# Patient Record
Sex: Male | Born: 1945
Health system: Southern US, Community
[De-identification: ages and names within clinical notes are randomized; demographics above are authoritative.]

## PROBLEM LIST (undated history)

## (undated) DIAGNOSIS — M439 Deforming dorsopathy, unspecified: Secondary | ICD-10-CM

## (undated) DIAGNOSIS — Z9989 Dependence on other enabling machines and devices: Secondary | ICD-10-CM

## (undated) DIAGNOSIS — Z974 Presence of external hearing-aid: Secondary | ICD-10-CM

## (undated) DIAGNOSIS — K219 Gastro-esophageal reflux disease without esophagitis: Secondary | ICD-10-CM

## (undated) DIAGNOSIS — M1990 Unspecified osteoarthritis, unspecified site: Secondary | ICD-10-CM

## (undated) DIAGNOSIS — R29898 Other symptoms and signs involving the musculoskeletal system: Secondary | ICD-10-CM

## (undated) DIAGNOSIS — I10 Essential (primary) hypertension: Secondary | ICD-10-CM

## (undated) DIAGNOSIS — A809 Acute poliomyelitis, unspecified: Secondary | ICD-10-CM

## (undated) DIAGNOSIS — F40240 Claustrophobia: Secondary | ICD-10-CM

## (undated) HISTORY — PX: FEMUR SURGERY: SHX943

## (undated) HISTORY — PX: JOINT REPLACEMENT: SHX530

## (undated) HISTORY — PX: TIBIA FRACTURE SURGERY: SHX806

---

## 1998-02-01 HISTORY — PX: CARDIAC CATHETERIZATION: SHX172

## 2011-06-30 ENCOUNTER — Ambulatory Visit: Payer: Self-pay | Admitting: Gastroenterology

## 2011-10-13 ENCOUNTER — Other Ambulatory Visit: Payer: Self-pay | Admitting: Neurosurgery

## 2011-10-13 DIAGNOSIS — M549 Dorsalgia, unspecified: Secondary | ICD-10-CM

## 2011-10-19 ENCOUNTER — Ambulatory Visit
Admission: RE | Admit: 2011-10-19 | Discharge: 2011-10-19 | Disposition: A | Discharge status: Home / Self Care | Payer: Medicare Other | Source: Ambulatory Visit | Attending: Neurosurgery | Admitting: Neurosurgery

## 2011-10-19 DIAGNOSIS — M549 Dorsalgia, unspecified: Secondary | ICD-10-CM

## 2011-11-29 ENCOUNTER — Ambulatory Visit: Payer: Self-pay | Admitting: Pain Medicine

## 2012-03-22 ENCOUNTER — Ambulatory Visit: Payer: Self-pay | Admitting: Gastroenterology

## 2012-03-24 LAB — PATHOLOGY REPORT

## 2012-09-25 ENCOUNTER — Ambulatory Visit: Payer: Self-pay | Admitting: General Practice

## 2012-09-25 LAB — URINALYSIS, COMPLETE
Bilirubin,UR: NEGATIVE
Glucose,UR: NEGATIVE mg/dL (ref 0–75)
Hyaline Cast: 2
Ketone: NEGATIVE
Leukocyte Esterase: NEGATIVE
Protein: NEGATIVE
RBC,UR: <1 /HPF (ref 0–5)
Specific Gravity: 1.021 (ref 1.003–1.030)

## 2012-09-25 LAB — PROTIME-INR: Prothrombin Time: 11.8 secs (ref 11.5–14.7)

## 2012-09-25 LAB — BASIC METABOLIC PANEL
Anion Gap: 4 — ABNORMAL LOW (ref 7–16)
Calcium, Total: 8.8 mg/dL (ref 8.5–10.1)
Chloride: 106 mmol/L (ref 98–107)
Creatinine: 0.94 mg/dL (ref 0.60–1.30)
EGFR (African American): >60
Glucose: 71 mg/dL (ref 65–99)
Potassium: 4.7 mmol/L (ref 3.5–5.1)

## 2012-09-25 LAB — CBC
HCT: 39.6 % — ABNORMAL LOW (ref 40.0–52.0)
Platelet: 227 10*3/uL (ref 150–440)
RDW: 13.2 % (ref 11.5–14.5)
WBC: 5.8 10*3/uL (ref 3.8–10.6)

## 2012-09-25 LAB — SEDIMENTATION RATE: Erythrocyte Sed Rate: 7 mm/hr (ref 0–20)

## 2012-09-25 LAB — MRSA PCR SCREENING

## 2012-09-26 LAB — URINE CULTURE

## 2012-10-09 ENCOUNTER — Inpatient Hospital Stay: Payer: Self-pay | Admitting: General Practice

## 2012-10-10 LAB — BASIC METABOLIC PANEL
BUN: 14 mg/dL (ref 7–18)
Calcium, Total: 7.8 mg/dL — ABNORMAL LOW (ref 8.5–10.1)
Chloride: 104 mmol/L (ref 98–107)
Co2: 25 mmol/L (ref 21–32)
EGFR (Non-African Amer.): >60
Glucose: 102 mg/dL — ABNORMAL HIGH (ref 65–99)
Osmolality: 273 (ref 275–301)
Potassium: 3.5 mmol/L (ref 3.5–5.1)
Sodium: 136 mmol/L (ref 136–145)

## 2012-10-10 LAB — PLATELET COUNT: Platelet: 178 10*3/uL (ref 150–440)

## 2012-10-10 LAB — HEMOGLOBIN: HGB: 9.9 g/dL — ABNORMAL LOW (ref 13.0–18.0)

## 2012-10-11 LAB — BASIC METABOLIC PANEL
Anion Gap: 4 — ABNORMAL LOW (ref 7–16)
BUN: 10 mg/dL (ref 7–18)
Calcium, Total: 8.2 mg/dL — ABNORMAL LOW (ref 8.5–10.1)
Chloride: 108 mmol/L — ABNORMAL HIGH (ref 98–107)
Co2: 28 mmol/L (ref 21–32)
EGFR (Non-African Amer.): >60
Glucose: 105 mg/dL — ABNORMAL HIGH (ref 65–99)
Osmolality: 279 (ref 275–301)
Potassium: 3.9 mmol/L (ref 3.5–5.1)

## 2012-10-11 LAB — PLATELET COUNT: Platelet: 198 10*3/uL (ref 150–440)

## 2012-10-11 LAB — PATHOLOGY REPORT

## 2012-10-11 LAB — HEMOGLOBIN: HGB: 10.2 g/dL — ABNORMAL LOW (ref 13.0–18.0)

## 2012-10-13 ENCOUNTER — Encounter: Payer: Self-pay | Admitting: Internal Medicine

## 2012-11-01 ENCOUNTER — Encounter: Payer: Self-pay | Admitting: Internal Medicine

## 2013-05-01 ENCOUNTER — Ambulatory Visit: Payer: Self-pay | Admitting: General Practice

## 2013-08-27 ENCOUNTER — Ambulatory Visit: Payer: Self-pay | Admitting: Orthopedic Surgery

## 2013-08-27 LAB — URINALYSIS, COMPLETE
Bacteria: NONE SEEN
Bilirubin,UR: NEGATIVE
Blood: NEGATIVE
Glucose,UR: NEGATIVE mg/dL (ref 0–75)
Hyaline Cast: 2
KETONE: NEGATIVE
Leukocyte Esterase: NEGATIVE
Nitrite: NEGATIVE
PROTEIN: NEGATIVE
Ph: 6 (ref 4.5–8.0)
RBC,UR: <1 /HPF (ref 0–5)
SQUAMOUS EPITHELIAL: NONE SEEN
Specific Gravity: 1.016 (ref 1.003–1.030)
WBC UR: NONE SEEN /HPF (ref 0–5)

## 2013-08-27 LAB — MRSA PCR SCREENING

## 2013-08-27 LAB — CBC
HCT: 41.9 % (ref 40.0–52.0)
HGB: 13.8 g/dL (ref 13.0–18.0)
MCH: 31.5 pg (ref 26.0–34.0)
MCHC: 32.9 g/dL (ref 32.0–36.0)
MCV: 96 fL (ref 80–100)
Platelet: 243 10*3/uL (ref 150–440)
RBC: 4.39 10*6/uL — ABNORMAL LOW (ref 4.40–5.90)
RDW: 14.3 % (ref 11.5–14.5)
WBC: 7 10*3/uL (ref 3.8–10.6)

## 2013-08-27 LAB — BASIC METABOLIC PANEL
ANION GAP: 6 — AB (ref 7–16)
BUN: 16 mg/dL (ref 7–18)
CALCIUM: 8.8 mg/dL (ref 8.5–10.1)
CHLORIDE: 104 mmol/L (ref 98–107)
Co2: 28 mmol/L (ref 21–32)
Creatinine: 0.86 mg/dL (ref 0.60–1.30)
EGFR (African American): >60
EGFR (Non-African Amer.): >60
GLUCOSE: 75 mg/dL (ref 65–99)
OSMOLALITY: 276 (ref 275–301)
POTASSIUM: 4.5 mmol/L (ref 3.5–5.1)
Sodium: 138 mmol/L (ref 136–145)

## 2013-08-27 LAB — APTT: ACTIVATED PTT: 23.2 s — AB (ref 23.6–35.9)

## 2013-08-27 LAB — PROTIME-INR
INR: 0.8
Prothrombin Time: 11.4 secs — ABNORMAL LOW (ref 11.5–14.7)

## 2013-08-27 LAB — SEDIMENTATION RATE: Erythrocyte Sed Rate: 14 mm/hr (ref 0–20)

## 2013-09-13 ENCOUNTER — Inpatient Hospital Stay: Payer: Self-pay | Admitting: Orthopedic Surgery

## 2013-09-14 LAB — BASIC METABOLIC PANEL
Anion Gap: 7 (ref 7–16)
BUN: 7 mg/dL (ref 7–18)
CHLORIDE: 106 mmol/L (ref 98–107)
Calcium, Total: 7.2 mg/dL — ABNORMAL LOW (ref 8.5–10.1)
Co2: 28 mmol/L (ref 21–32)
Creatinine: 0.86 mg/dL (ref 0.60–1.30)
EGFR (African American): >60
EGFR (Non-African Amer.): >60
Glucose: 114 mg/dL — ABNORMAL HIGH (ref 65–99)
OSMOLALITY: 280 (ref 275–301)
Potassium: 3.9 mmol/L (ref 3.5–5.1)
Sodium: 141 mmol/L (ref 136–145)

## 2013-09-14 LAB — PLATELET COUNT: Platelet: 191 10*3/uL (ref 150–440)

## 2013-09-14 LAB — HEMOGLOBIN: HGB: 11.1 g/dL — AB (ref 13.0–18.0)

## 2013-09-15 LAB — HEMOGLOBIN: HGB: 10.9 g/dL — ABNORMAL LOW (ref 13.0–18.0)

## 2013-09-17 LAB — PATHOLOGY REPORT

## 2013-12-30 LAB — BASIC METABOLIC PANEL
ANION GAP: 8 (ref 7–16)
BUN: 13 mg/dL (ref 7–18)
CALCIUM: 8.5 mg/dL (ref 8.5–10.1)
Chloride: 105 mmol/L (ref 98–107)
Co2: 27 mmol/L (ref 21–32)
Creatinine: 0.97 mg/dL (ref 0.60–1.30)
EGFR (African American): >60
GLUCOSE: 120 mg/dL — AB (ref 65–99)
Osmolality: 281 (ref 275–301)
POTASSIUM: 4 mmol/L (ref 3.5–5.1)
SODIUM: 140 mmol/L (ref 136–145)

## 2013-12-30 LAB — CBC
HCT: 40.5 % (ref 40.0–52.0)
HGB: 12.8 g/dL — AB (ref 13.0–18.0)
MCH: 28.3 pg (ref 26.0–34.0)
MCHC: 31.7 g/dL — ABNORMAL LOW (ref 32.0–36.0)
MCV: 89 fL (ref 80–100)
Platelet: 247 10*3/uL (ref 150–440)
RBC: 4.53 10*6/uL (ref 4.40–5.90)
RDW: 14 % (ref 11.5–14.5)
WBC: 8.3 10*3/uL (ref 3.8–10.6)

## 2013-12-31 ENCOUNTER — Inpatient Hospital Stay: Payer: Self-pay | Admitting: Orthopedic Surgery

## 2014-01-03 LAB — CREATININE, SERUM
Creatinine: 0.94 mg/dL (ref 0.60–1.30)
EGFR (Non-African Amer.): >60

## 2014-01-05 ENCOUNTER — Encounter: Payer: Self-pay | Admitting: Internal Medicine

## 2014-02-01 ENCOUNTER — Encounter: Payer: Self-pay | Admitting: Internal Medicine

## 2014-03-04 ENCOUNTER — Encounter: Payer: Self-pay | Admitting: Internal Medicine

## 2014-04-02 ENCOUNTER — Encounter
Admit: 2014-04-02 | Disposition: A | Discharge status: Home / Self Care | Payer: Self-pay | Attending: Internal Medicine | Admitting: Internal Medicine

## 2014-05-03 ENCOUNTER — Encounter
Admit: 2014-05-03 | Disposition: A | Discharge status: Home / Self Care | Payer: Self-pay | Attending: Internal Medicine | Admitting: Internal Medicine

## 2014-05-24 NOTE — Op Note (Signed)
PATIENT NAME:  [name], [name] MR#:  [id number] DATE OF BIRTH:  01/29/1946  DATE OF PROCEDURE:  10/09/2012  PREOPERATIVE DIAGNOSIS: Degenerative arthrosis of the right hip.   POSTOPERATIVE DIAGNOSIS: Degenerative arthrosis of the right hip.   PROCEDURE PERFORMED: Right total hip arthroplasty.   SURGEON: Laurice Record. Hooten, M.D.   ASSISTANT:  Vance Peper, PA (required to maintain retraction throughout the procedure).   ANESTHESIA: Spinal.   ESTIMATED BLOOD LOSS: 75 mL.   FLUIDS REPLACED: 1200 mL of crystalloid.   DRAINS: Two medium drains to a Hemovac reservoir.   IMPLANTS UTILIZED: DePuy 15 mm large-stature AML femoral component, 60 mm outer diameter Pinnacle Gription sector component, two 6.5 mm Pinnacle cancellus screws, +4 mm 10 degree Pinnacle Marathon polyethylene liner, a 36 mm M-SPEC femoral head with a +5 mm neck length.   INDICATIONS FOR SURGERY: The patient is a 69 year old gentleman with history of polio affecting the left lower extremity, who has had progressive right hip and groin pain. X-rays demonstrated severe degenerative changes of the right hip. After discussion of the risks and benefits of surgical intervention, the patient expressed understanding of the risks and benefits, and agreed with plans for surgical intervention.   PROCEDURE IN DETAIL: The patient was brought into the Operating Room and, after adequate spinal anesthesia was achieved, the patient was placed in the left lateral decubitus position. Axillary roll was placed and all bony prominences were well padded. The patient's right hip and leg were cleaned and prepped with alcohol and DuraPrep, and draped in the usual sterile fashion. A "timeout" was performed as per usual protocol.   A lateral curvilinear incision was made gently curving towards the posterior superior iliac spine. IT band was incised in line with the skin incisions. Fibers of the gluteus maximus were split in line. Piriformis tendon was identified,  skeletonized, incised at its insertion on the proximal femur and reflected posteriorly. In a similar fashion, the short external rotators were incised and reflected posteriorly. A T-type posterior capsulotomy was performed. Prior to dislocation of the femoral head, a threaded Steinmann pin was inserted through a separate stab incision into the pelvis superior to the acetabulum and bent in the form of a stylus so as to assess limb length and hip offset throughout the procedure. The femoral head was then dislocated posteriorly. Severe degenerative changes were noted with full-thickness loss of articular cartilage and prominent osteophytes about the femoral neck. The femoral neck cut was performed using an oscillating saw. The anterior capsule was elevated off of the femoral neck. Inspection of the acetabulum also demonstrated severe degenerative changes with osteophytes about the circumference of the cup. The acetabulum was then reamed in a sequential fashion up to a 59 mm diameter. Good punctate bleeding bone was encountered. A 60 mm Pinnacle Gription sector cup acetabular component was positioned and impacted into place. Good scratch fit was appreciated. Two 6.5 mm screws were was inserted through the dome holes for additional fixation. A +4 mm neutral polyethylene trial was inserted and attention was directed to the proximal tibia.   A pilot hole for reaming of the proximal femoral canal was created using a high-speed burr. The femoral canal was then reamed in a sequential fashion up to a 14.5 mm diameter. This allowed for approximately 6 cm of scratch fit. The proximal femur was then prepared using a 15 mm aggressive side-biting reamer. Serial broaches were inserted up to a 15 mm large-stature broach. The calcar region was planed and trial reduction  was performed using first 1.5 and subsequently a +5 mm neck length. Reasonably good stability was appreciated. The +4 neutral polyethylene was replaced with a +4, 10  degree liner with the high side at approximately the 8 o'clock position. This allowed for excellent stability in both anterior and posterior planes with good restoration of the leg length and the hip offset. Trials were removed. The acetabular shell was irrigated with copious amounts of normal saline with antibiotic solution and then suctioned dry. A +4 mm, 10 degree Pinnacle Marathon polyethylene liner was positioned with the high side at 8 o'clock position. Next, a 50 mm large-stature AML femoral component was positioned and impacted into place. Excellent scratch fit was appreciated. Trial reduction was again performed with a +5 mm neck length. Excellent stability was noted both anteriorly and posteriorly, and good equalization of limb lengths was noted. Trial hip all was removed. The Morse taper was cleaned and dried. A 36 mm M-SPEC femoral head with a +5 mm neck length was placed on the trunnion and impacted into place. The hip was reduced and placed through range of motion. Again, good equalization of limb lengths and hip offset also was noted and excellent stability was appreciated both anteriorly and posteriorly.   The wound was irrigated with copious amounts of normal saline with antibiotic solution using pulsatile lavage and then suctioned dry. Good hemostasis was noted. The posterior capsulotomy was repaired using #5 Ethibond. The piriformis tendon was reapproximated on the surface of the gluteus medius tendon using #5 Ethibond. Two medium drains were placed in the wound bed and brought out through a separate stab incision to be attached to a Hemovac reservoir. The IT band was repaired using interrupted sutures of #1 Vicryl. The subcutaneous tissue was approximated in layers using first #0 Vicryl followed by #2-0 Vicryl. Skin was closed with skin staples. Sterile dressing was applied. The patient tolerated the procedure well. He was transported to the recovery room in stable condition.    ____________________________ Laurice Record. Holley Bouche., MD [email or web address]:cs D: 10/09/2012 14:46:09 ET T: 10/09/2012 15:36:16 ET JOB#: [id number]  cc: Jeneen Rinks P. Holley Bouche., MD, <Dictator> JAMES P Holley Bouche MD ELECTRONICALLY SIGNED 10/11/2012 7:21

## 2014-05-24 NOTE — Discharge Summary (Signed)
PATIENT NAME:  [name], [name] MR#:  [id number] DATE OF BIRTH:  03-Jan-1946  DATE OF ADMISSION:  10/09/2012 DATE OF DISCHARGE:  10/12/2012   ADMITTING DIAGNOSIS: Degenerative arthrosis of the right hip.   DISCHARGE DIAGNOSIS: Degenerative arthrosis of the right hip.   OPERATION: On 10/09/2012, the patient had a right total hip arthroplasty by Dr. Marry Guan.   ASSISTANT: Vance Peper, PA   ANESTHESIA: Spinal.   ESTIMATED BLOOD LOSS: 75 mL.   FLUIDS REPLACED: 1200 mL of crystalloid.   DRAINS: Two medium drains to Hemovac.   IMPLANTS USED: A DePuy 15 mm large stature AML femoral component, 60 mm outer diameter Pinnacle Gription sector component, two 6.5 mm pedicle cancellous screws, +4 mm 10 degree Pinnacle Marathon polyethylene liner, a 36 mm M-SPEC femoral head with a +5 mm neck length.   The patient was stabilized, brought to the recovery room and then brought down to the orthopedic floor.   HISTORY: The patient is a 69 year old male who presented for persistent pain in the right hip. The patient has postpolio syndrome. The patient has failed conservative treatment and is ready for surgery. The patient has had a percutaneous pinning of the left femoral neck in the remote past.   PHYSICAL EXAMINATION:  GENERAL: Well-developed, well-nourished male in no acute distress.  CARDIAC: Normal sinus rhythm.  LUNGS: Clear to auscultation.  MUSCULOSKELETAL: In regard to the right hip, the patient has a moderate antalgic gait favoring the left leg secondary to atrophy of the left leg. The patient has range of motion of the right hip that is flexion of 60 degrees and external rotation 10 degrees with 0 degrees internal rotation. There is some flexion strength of 3 out of 5 on the right.    HOSPITAL COURSE: After initial admission on 10/09/2012, the patient was brought to the orthopedic floor. The patient's hemoglobin on postoperative day 1 was at 9.9 and was at 10.2 on postoperative day 2. The patient was  stable. He worked with physical therapy initially and ambulated about 25 feet on postoperative day 1. The patient then went up to 100 feet on postoperative day 2 and did well. The patient was ready to go to rehab center on 10/12/2012.   CONDITION AT DISCHARGE: Stable.   DISPOSITION: The patient was sent to rehab center for physical therapy.   DISCHARGE INSTRUCTIONS:  1. The patient is weight-bear as tolerated on the right leg.  2. The patient will elevate his leg with 1 to 2 pillows.  3. The patient will use knee-high TED hose on both legs, to be removed 1 hour every 8-hour shift.  4. The patient will try to elevate the heels off the bed.  5. The patient was encouraged to do cough and deep breathing and to use the incentive spirometer.  6. Diet is regular.  7. The patient will use good dressing care and try to limit getting the dressing wet. The patient will have the dressing changed as needed.  8. The patient will have his staples removed around 10/23/2012. 9. The rehab center or the patient will call the clinic if there is any bright red bleeding, calf pain, bowel or bladder difficulty or fever greater than 101.5.   DISCHARGE MEDICATIONS:  1. Simvastatin 40 mg 1 tablet p.o. at bedtime.  2. Diazepam 5 mg q.8 hours as needed.  3. Lyrica 50 mg daily. 4. Allegra 180 mg p.o. daily. 5. Escitalopram 10 mg 1 tablet daily.  6. Ambien 10 mg p.o. at bedtime.  7. Metoprolol 50 mg p.o. daily.  8. Metoprolol 25 mg at bedtime.  9. Simethicone 125 mg 2 capsules after meals. 10. Fluorouracil topical 5% topically to affected areas b.i.d. 11. Vitamin B12 injectable once a month.  12. EpiPen 1 dose subcutaneous p.r.n.  13. Aqua Glycolic topically 3 times week. 14. Pregabalin 50 mg at bedtime. 15. Simethicone 80 mg chewable 3 times a day. 16. Lovenox 40 mg subcutaneous for 14 days, then discontinue and begin 81 mg p.o. daily of aspirin.  17. Oxycodone 5 mg p.o. 1 to 2 tablets q.4 hours p.r.n. for  severe pain.  18. Tramadol 50 mg 1 to 2 tablets q.4 hours p.r.n. for mild to moderate pain. 19. Tylenol 500 mg 1 tablet q.4 hours as needed for pain or fever greater than 100.4.  20. Celebrex 200 mg 1 capsule b.i.d. 21. Magnesium hydroxide 8% 30 mL 2 times a day as needed for constipation.  22. Bisacodyl 10 mg per rectum on a p.r.n. basis.  23. Senokot-S 1 tablet p.o. b.i.d. 24. Omeprazole 20 mg orally 1 capsule daily.    ____________________________ Lenna Sciara. Reche Dixon, [place] [email or web address] D: 10/12/2012 07:27:59 ET T: 10/12/2012 07:39:10 ET JOB#: [id number]  cc: J. Reche Dixon, [place], <Dictator> J Donnalyn Juran [facility] PA ELECTRONICALLY SIGNED 10/14/2012 6:24

## 2014-05-25 NOTE — H&P (Signed)
Subjective/Chief Complaint left knee and thigh pain   History of Present Illness Mr. [name] [name] is a 69 year old male with PMH of polio with L>R residual paresis who is known to Dr. Marry Guan (R THA 10/2012) and Dr. Rudene Christians (L THA 09/2013). He comes in today with left thigh pain after a fall yesterday. He has had prior episodes of pain to the left groin and hip, and has seen Dr. Rudene Christians for followup of these symptoms. He denied any recent antecedent pain and was ambulating at his baseline prior to the fall. He denies any head trauma LOC or presyncope. He states after the mechanical fall yesterday he was unable to bear weight on the left leg.   Past History Arthritis HLD HTN Polio R THA 10/2012 Hooten L THA 09/2013 Menz   Code Status Full Code   Past Med/Surgical Hx:  Arthritis:   Hyperlipidemia:   HTN:   Hip Replacement - Left:   Hip Replacement - Right:   Left femur 3 screws  following a fracture,:   anal fissure:   right knee interupted  growth plate.:   tonslilectomy:   left thigh for polio?:   ALLERGIES:  Bee Stings: Anaphylaxis  Morphine: Other, Hypotension  HOME MEDICATIONS: Medication Instructions Status  traMADol 50 mg oral tablet 1 tab(s) orally every 4 to 6 hours, As Needed, mild pain (1-3/10) , As needed, mild pain (1-3/10) Active  Prevacid OTC 15 mg oral delayed release capsule 1 cap(s) orally once a day Active  cyclobenzaprine 5 mg oral tablet 1 tab(s) orally once a day, As Needed for muscle spasms Active  Co Q-10 100 mg oral capsule 2 cap(s) orally once a day Active  simvastatin 40 mg oral tablet 1 tab(s) orally once a day (at bedtime) Active  diazepam 5 mg oral tablet 1 tab(s) orally every 8 hours as needed for muscle spaasms. Active  escitalopram 10 mg oral tablet 1 tab(s) orally once a day (at bedtime) Active  zolpidem 10 mg oral tablet 1 tab(s) orally once a day (at bedtime), As Needed Active  Vitamin B-12 1000 mcg/mL injectable solution 1 milliliter(s) injectable once a  month Active  Epi EZ Pen 1 dose(s) subcutaneous as needed for allergic reactions. Active  diphenhydrAMINE 25 mg oral tablet 1 tab(s) orally up to 4 times a day as needed for allergy symptoms. Active  Metoprolol Tartrate 50 mg oral tablet 1 tab(s) orally once a day (in the morning) and 0.5 tab (48m) orally once a day (in the evening). Active   Review of Systems:  Fever/Chills No   Cough No   Sputum No   Abdominal Pain No   Diarrhea No   Constipation No   Nausea/Vomiting No   SOB/DOE No   Chest Pain No   Dysuria No   Tolerating Diet Yes   Medications/Allergies Reviewed Medications/Allergies reviewed   Physical Exam:  GEN no acute distress   HEENT PERRL, hearing intact to voice, dry oral mucosa   NECK supple  trachea midline   RESP normal resp effort   CARD regular rate   ABD denies tenderness   SKIN normal to palpation   Additional Comments LLE exam:   equinovarus deformity consistent with baseline no toe or ankle motion Knee effusion, atrophy of the thigh and leg no TTP of tibia, ankle or foot with normal sensation in DPN/SPN/TN TTP diffusely surrounding knee and distal femur, minimal discomfort with tenderness of upper thigh and hip unable to range knee or hip limited by  pain Brisk capillary refill   Lab Results: Routine Chem:  29-Nov-15 08:59   Glucose, Serum  120  BUN 13  Creatinine (comp) 0.97  Sodium, Serum 140  Potassium, Serum 4.0  Chloride, Serum 105  CO2, Serum 27  Calcium (Total), Serum 8.5  Anion Gap 8  Osmolality (calc) 281  eGFR (African American) >60  eGFR (Non-African American) >60 (eGFR values <60mL/min/1.73 m2 may be an indication of chronic kidney disease (CKD). Calculated eGFR, using the MRDR Study equation, is useful in  patients with stable renal function. The eGFR calculation will not be reliable in acutely ill patients when serum creatinine is changing rapidly. It is not useful in patients on dialysis. The eGFR  calculation may not be applicable to patients at the low and high extremes of body sizes, pregnant women, and vegetarians.)  Routine Hem:  29-Nov-15 08:59   WBC (CBC) 8.3  RBC (CBC) 4.53  Hemoglobin (CBC)  12.8  Hematocrit (CBC) 40.5  Platelet Count (CBC) 247 (Result(s) reported on 30 Dec 2013 at 09:13AM.)  MCV 89  MCH 28.3  MCHC  31.7  RDW 14.0   Radiology Results: XRay:    13-Aug-15 10:25, Hip Left One View  Hip Left One View  REASON FOR EXAM:    Fracture reduction/fixation  COMMENTS:       PROCEDURE: DXR - DXR HIP LEFT ONE VIEW  - Sep 13 2013 10:25AM     CLINICAL DATA:  Fracture reduction and fixation.    EXAM:  LEFT HIP - 1 VIEW:    COMPARISON:  None.    FINDINGS:  Imageand images timed at 821 hr on September 13, 2013 reveals the  presence of 3 compression screws through the femoral head and neck.  Irregularity of the articular surface of the femur may reflect an  acute fracture. Subsequent images reveal the patient ofundergone a  total hip arthroplasty.     IMPRESSION:  The patient has undergone total right hip arthroplasty. The  visualized native bone and prosthetic components exhibit no  immediate postprocedure complication.      Electronically Signed    By: David  Jordan    On: 09/13/2013 10:48       Verified By: DAVID A. JORDAN, M.D., MD    13-Aug-15 11:28, Hip Left Complete  Hip Left Complete  REASON FOR EXAM:    s/p THA  COMMENTS:   Bedside (portable):Y    PROCEDURE: DXR - DXR HIP LEFT COMPLETE  - Sep 13 2013 11:28AM     CLINICAL DATA:  Post left hip replaced    EXAM:  LEFT HIP - COMPLETE 2+ VIEW    COMPARISON:  C-arm spot films intraoperatively performed today    FINDINGS:  The prior screws for fixation of the left femoral head and neck have  been removed. A left total hip replacement has been performed with  no complicating features noted.     IMPRESSION:  Left total hip replacementin good position with no  complicating  features.      Electronically Signed    By: Paul  Barry M.D.    On: 09/13/2013 13:54         Verified By: PAUL D. BARRY, M.D.,    29-Nov-15 09:37, Chest Portable Single View  Chest Portable Single View  REASON FOR EXAM:    pre op planning  COMMENTS:       PROCEDURE: DXR - DXR PORTABLE CHEST SINGLE VIEW  - Dec 30 2013  9:37AM       CLINICAL DATA:  Preop    EXAM:  PORTABLE CHEST - 1 VIEW    COMPARISON:  None.    FINDINGS:  Lungs are essentially clear.  No pleural effusion or pneumothorax.  The heart is top-normal in size.     IMPRESSION:  No evidence of acute cardiopulmonary disease.      Electronically Signed    By: Julian Hy M.D.    On: 12/30/2013 10:46         Verified By: Julian Hy, M.D.,    29-Nov-15 09:37, Femur Left  Femur Left  REASON FOR EXAM:    left mid femur pain, recent hip arthrosis  COMMENTS:       PROCEDURE: DXR - DXR FEMUR LEFT  - Dec 30 2013  9:37AM     ADDENDUM REPORT: 12/30/2013 12:41    ADDENDUM:  On additional views, there is a subtle linear lucency along the  intra medullary shaft of the distal femur, best visualized on the  lateral view, corresponding to a nondisplaced hairline fracture.  Associated suprapatellar lipohemarthrosis.    This is better evaluated on subsequent CT.  Electronically Signed    By: Julian Hy M.D.    On: 12/30/2013 12:41    CLINICAL DATA:  Fall, left hip pain    EXAM:  LEFT FEMUR - 2 VIEW    COMPARISON:  09/13/2013    FINDINGS:  Left total hip arthroplasty.    No evidence of hardware loosening.  No fracture or dislocation is seen.    Visualized bony pelvis appears intact.     IMPRESSION:  Left total hip arthroplasty, without evidence of complication.    No fracture or dislocation is seen.    Electronically Signed:  By: Julian Hy M.D.  On: 12/30/2013 10:43         Verified By: Julian Hy, M.D.,    29-Nov-15 09:37, Pelvis AP Only   Pelvis AP Only  REASON FOR EXAM:    left mid femur pain, recent hip arthrosis  COMMENTS:       PROCEDURE: DXR - DXR PELVIS AP ONLY  - Dec 30 2013  9:37AM     CLINICAL DATA:  Fall, left hip pain    EXAM:  PELVIS - 1-2 VIEW    COMPARISON:  Left hip radiographs dated 09/13/2013    FINDINGS:  Bilateral total hip arthroplasties.  No evidence of complication.    Visualized bony pelvis appears intact.    Degenerative changes of the lower lumbar spine.     IMPRESSION:  Bilateral total hip arthroplasties.    No fracture or dislocation is seen.      Electronically Signed    By: Julian Hy M.D.    On: 12/30/2013 10:45     Verified By: Julian Hy, M.D.,  [facility]:    31-Mar-15 10:39, MRI Lumbar Spine Without Contrast  PACS Image    13-Aug-15 10:25, Hip Left One View  PACS Image    13-Aug-15 11:28, Hip Left Complete  PACS Image    29-Nov-15 09:37, Chest Portable Single View  PACS Image    29-Nov-15 09:37, Femur Left  PACS Image    29-Nov-15 09:37, Pelvis AP Only  PACS Image    29-Nov-15 11:21, CT Femur/Thigh Left Without Contrast  PACS Image  MRI:    31-Mar-15 10:39, MRI Lumbar Spine Without Contrast  MRI Lumbar Spine Without Contrast  REASON FOR EXAM:    Low back pain  COMMENTS:  PROCEDURE: MR  - MR LUMBAR SPINE WO CONTRAST  - May 01 2013 10:39AM     CLINICAL DATA:  Low back and right leg pain for 4-6 weeks.    EXAM:  MRI LUMBAR SPINE WITHOUT CONTRAST    TECHNIQUE:  Multiplanar, multisequence MR imaging was performed. No intravenous  contrast was administered.    COMPARISON:  None.  FINDINGS:  There is an assimilation joint on the right at L5-S1.    There is straightening of the normal lumbar lordosis. There is  minimal retrolisthesis of L3 on L4. There is mild upper lumbar  dextroscoliosis. There is severe disc space narrowing at L4-5, with  possible partial vertebral body fusion laterally on the right. Disc  desiccation and mild to  moderate disc space narrowing are present at  L1-2 with adjacent degenerative marrow changes. Mild to moderate  disc space narrowing is also present at L3-4. The conus medullaris  is normal in signal and terminates at the inferior aspect of L1. The  visualized paraspinal soft tissues are unremarkable.    T12-L1:  Mild facet arthrosis without stenosis.  L1-2: Mild disc bulge and moderate facet hypertrophy, left greater  than right, result in mild left lateral recess narrowing and  moderate left neural foraminal stenosis.    L2-3: Minimal disc bulge and mild facet hypertrophy without  stenosis.    L3-4: Mild disc bulge and right greater than left facet hypertrophy  without stenosis.    L4-5: Mild endplate spurring and facet hypertrophy without stenosis.    L5-S1: Mild disc bulgeand facet hypertrophy result in moderate  right neural foraminal stenosis.   IMPRESSION:  Lumbar dextroscoliosis with multilevel degenerative disc disease and  facet arthrosis. There is moderate neural foraminal stenosis on the  left at L1-2 and on the right at L5-S1. No spinal canal stenosis.      Electronically Signed    By: Logan Bores    On: 05/01/2013 11:16         Verified By: Ferol Luz, M.D.,  CT:    29-Nov-15 11:21, CT Femur/Thigh Left Without Contrast  CT Femur/Thigh Left Without Contrast  REASON FOR EXAM:    please include entire femur, pain with possible non   displaced fracture of the le  COMMENTS:       PROCEDURE: CT  - CT FEMUR/ THIGH LEFT WO  - Dec 30 2013 11:21AM     CLINICAL DATA:  Left hip pain secondary to a fall last night at  home. Bilateral total hip prostheses.    EXAM:  CT OF THE LEFT THIGH WITHOUT CONTRAST    TECHNIQUE:  Multidetector CT imaging of the left thigh was performed according  to the standard protocol.  COMPARISON:  Radiographs dated 12/30/2013    FINDINGS:  There is a nondisplaced hairline spiral fracture of the distal  femoral shaft beginning  approximately 12 cm above the joint space.  The fracture extends in a sagittal plane distally through the  intercondylar notch into the medial aspect of the lateral femoral  condyle.    There is a component of the fracture which is sagittal-oblique  extending through the medial femoral condyle and extending through  the cortex of the medial aspect of the distal metaphysis.    There is a hemarthrosis at the knee.  The proximal femur is intact. Hip prosthesis is in place with no  dislocation or evidence of loosening. The visualized pelvic bones  are normal.     IMPRESSION:  Complex fracture of the distal left femoral shaft and femoral  condyles. No displacement.      Electronically Signed    By: Jim  Maxwell M.D.    On: 12/30/2013 12:25         Verified By: JAMES H. MAXWELL, M.D.,    Assessment/Admission Diagnosis 68 M with nondisplaced distal femur fracture, concern for possible exstension of fracture to area just distal to hip prosthesis versus vascular channel as noted on sagittal CT only on 1 slice. Given the patients history and the nondisplaced nature of these fractures, plan for now is to treat nonoperatively.. He will be admitted to the hospital for intractable pain, as he has requried IV dilaudid in the ED to remain comfortable. I have spoken with Dr. Hooten who is in agreement with the plan.   Plan A knee immobilizer and bulky compressive dressing placed He will be NWB on his LLE His blood pressure was high in the ED and we will restart his medications and monitor it closely. Pain control with oxycodone and dilaudid DVT ppx with lovenox PT for mobilization   Electronic Signatures: Kahn, Mani D (MD)  (Signed 29-Nov-15 17:20)  Authored: CHIEF COMPLAINT and HISTORY, PAST MEDICAL/SURGIAL HISTORY, ALLERGIES, HOME MEDICATIONS, REVIEW OF SYSTEMS, PHYSICAL EXAM, LABS, Radiology, ASSESSMENT AND PLAN   Last Updated: 29-Nov-15 17:20 by Kahn, Mani D (MD) 

## 2014-05-25 NOTE — Op Note (Signed)
PATIENT NAME:  [name] [name], [name] [name] MR#:  [id number] DATE OF BIRTH:  [birth year]  DATE OF PROCEDURE:  09/13/2013  PREOPERATIVE DIAGNOSES: Avascular necrosis left hip, prior open reduction internal fixation and painful hardware.   POSTOPERATIVE DIAGNOSES:  Avascular necrosis left hip, prior open reduction internal fixation and painful hardware.   PROCEDURE: Removal of deep hardware left hip, total hip replacement.   SURGEON: Laurene Footman, MD.  ASSISTANT:  Laurice Record. Holley Bouche., MD.  ANESTHESIA: Spinal.   DESCRIPTION OF PROCEDURE: The patient was brought to the operating room and after adequate anesthesia was obtained, the patient was placed on the operative table with the Medacta attachment attached to the foot. There was significant stiffness to the hip, particularly in internal rotation noted. After prepping and draping, appropriate patient identification and timeout procedures were completed, the screws were approached 1st through a small lateral incision and the heads of the screws could be identified. They were started very posteriorly and they could not be removed initially.    Attention was then turned to the anterior hip with plan to be that we would expose the hip, remove a portion of the head and push that the screws out as they were exposed with head  removal.  Direct anterior approach was made. With the patient's history of polio, not unexpectedly, the muscle was essentially completely atrophied with very little muscle present and it did not contract. The capsule was opened and the femoral neck cut carried out. The head was then removed using an osteotome and as the screws were exposed, they were pushed out laterally.  It was with some difficulty they were removed, but they were removed with the appropriate screwdriver, as the screws having been placed. The remainder of the femoral head was removed and good visualization of the acetabulum with extensive pulvinar present. Reaming was carried out  to 48 mm.  Anteriorly and posteriorly this was appropriate size and the walls were thin.  It was deepened and there was good bleeding bone superiorly and medially, but laterally, the cup did not appear to be entirely covered.  A trial was placed and had very secure fit and so it was felt that this was a stable construct and was impacted into place and appeared quite stable. Going to this femur, minimal release was carried out. The leg was externally rotated and dropped into extension. Sequential broaching was carried out to a size 2 with S head trials, this gave very good appearance to the hip with restoration of length and lateral offset with the lateralized stem. The final components were assembled. The lateralized 2 AMIStem was impacted down the canal, followed by the dual mobility head. An S28 head with the 48 mm dual mobility liner. They were impacted and the hip was stable to external rotation at 90 degrees. The hip was thoroughly irrigated. The joint infiltrated with 30 mL of 0.25% Sensorcaine. The anterior capsule was closed with an Ethibond suture, heavy Quill for the deep fascia, 2-0 Quill subcutaneously and skin staples. The small lateral incision for hardware removal was closed with 2-0 Vicryl and skin staples. Xeroform, 4 x 4's, ABD, tape were applied and the patient was sent to the recovery room in stable condition.   ESTIMATED BLOOD LOSS: 600 mL.   COMPLICATIONS: None.   SPECIMEN: Removed femoral head. Screws were discarded.   IMPLANTS: Medacta Versafitcup DM, size 48 with liner S28 mm head and a 2 lateralized AMIStem.   ____________________________ Laurene Footman, MD mjm:DT D:  09/13/2013 15:56:15 ET T: 09/13/2013 17:36:26 ET JOB#: [id number]  cc: Laurene Footman, MD, <Dictator> Laurene Footman MD ELECTRONICALLY SIGNED 09/13/2013 19:16

## 2014-05-25 NOTE — Discharge Summary (Signed)
PATIENT NAME:  [name] [name], [name] [name] MR#:  [id number] DATE OF BIRTH:  1945/08/16  DATE OF ADMISSION:  12/31/2013 DATE OF DISCHARGE:  01/04/2014   ADMITTING DIAGNOSIS: Left distal femur periprosthetic fracture.   DISCHARGE DIAGNOSIS: Left distal femur periprosthetic fracture.   HISTORY: The patient is a 69 year old male with past medical history of polio, with left greater than right, residual paresis, who is known to Dr. Marry Guan and Dr. Rudene Christians. He comes in today with left thigh pain after a fall yesterday. He has had prior episodes of pain to the left groin and hip, and has seen Dr. Rudene Christians for followup of these symptoms. He denied any recent antecedent pain, and was ambulating at his baseline prior to the fall. He denies any head trauma, loss of consciousness or presyncope. He states after the mechanical fall yesterday, he was unable to bear weight on the left leg.   PHYSICAL EXAMINATION:  GENERAL: No acute distress.  HEENT: Pupils equal, round, and reactive to light. Hearing intact to voice.  NECK: Supple. Trachea midline.  RESPIRATIONS: Normal respiratory effort.  CARDIOVASCULAR: Regular rate.  ABDOMEN: Denies tenderness.  SKIN: Normal to palpation.  LEFT LOWER EXTREMITY: Deformity, consistent with baseline. No toe or ankle motion. He has knee effusion, atrophy of the thigh and leg. No tenderness to palpation over the tibia, ankle, or foot, with normal sensation. He is tender to palpation diffusely surrounding the distal femur. He has discomfort with tenderness of the upper thigh and hip; unable to rotate secondary to pain.   HOSPITAL COURSE: The patient was admitted to the hospital on 12/30/2013. He was placed on the orthopedic floor for observation and pain control. He was started out on IV Dilaudid and oxycodone. Pain was controlled, and he began to have some confusion with medications. He was tapered down to oxycodone and OxyContin. On 01/01/2014, a brace was ordered for the left lower extremity.  [employer] was called and an order was placed for an HKAFO hinged brace with clamshell around the distal femur. On 01/03/2014, the patient did receive this brace and began physical therapy. He complained of significant heaviness of the brace, and so this was discontinued and he was placed back into a knee immobilizer. The patient did do well with physical therapy, ambulating 45 feet. He was toe-touch weight-bearing on left lower extremity. On 01/04/2014, the patient was stable and ready for discharge to a rehab facility for continuation of physical therapy and occupational therapy. He is toe-touch weight-bearing in the knee immobilizer. He will follow up with [facility] in 2 weeks.   DISCHARGE INSTRUCTIONS: Left distal femur fracture; toe-touch weight-bearing left lower extremity, knee immobilizer on at all times, no knee range of motion. The patient may resume a regular diet as tolerated. Continue using Polar Care unit, maintaining temperature between 40 and 50 degrees. Try not to get the dressing or bandage wet or dirty. Call [facility] if the dressing gets water under it. Call for fever above 101.5 degrees, or if you are experiencing increased leg pain, numbness or weakness in legs, or bowel or bladder symptoms. Physical therapy has been arranged for continuation of care. Please call [facility] for appointment in 2 weeks.   DISCHARGE MEDICATIONS: Please see list of discharge medications on discharge instructions.    ____________________________ T. Rachelle Hora, PA-C tcg:MT D: 01/04/2014 07:58:03 ET T: 01/04/2014 08:07:42 ET JOB#: [id number]  cc: T. Rachelle Hora, PA-C, <Dictator> Duanne Guess [place] ELECTRONICALLY SIGNED 01/09/2014 12:37

## 2014-05-25 NOTE — Discharge Summary (Signed)
PATIENT NAME:  [name] [name], ULLOM MR#:  [id number] DATE OF BIRTH:  1945/03/06  DATE OF ADMISSION:  09/13/2013 DATE OF DISCHARGE:  09/15/2013  ADMITTING DIAGNOSIS: Avascular necrosis of left hip with prior open reduction and internal fixation and painful hardware.   DISCHARGE DIAGNOSIS: Avascular necrosis of left hip with prior open reduction and internal fixation and painful hardware.   OPERATION: On 09/13/2013, the patient had removal of deep hardware of the left hip and conversion to a total hip replacement.   SURGEON: Laurene Footman, MD.   ASSISTANT: Laurice Record. Holley Bouche., MD.   ANESTHESIA: Spinal.   IMPLANTS USED: Medacta Versafitcup DM, size 48 mm with liner S, 28 mm head, and a 2 lateralized AMI stem.   COMPLICATIONS: None.   ESTIMATED BLOOD LOSS: 600 mL.   DISPOSITION: The patient was stabilized, brought to the recovery room, and brought down to the orthopedic floor.   HISTORY AND PHYSICAL: The patient is a 69 year old male that presented for persistent pain and difficulty with his left hip after an ORIF done in 2002. The patient has had difficulty with ambulation. The patient does have a history of polio and has significant left leg weakness. The patient has a significant limp and having difficulty driving his car.   PHYSICAL EXAMINATION: GENERAL: Well-developed, well-nourished male having some difficulty with ambulation. LUNGS: Clear to auscultation.  HEART: Regular rate and rhythm. No murmur.  MUSCULOSKELETAL: In regard to his ambulation, the patient has abnormality with a moderate limp and a positive Trendelenburg to the left. The patient has range of motion of the hip on the left side that is -20 degrees extension, 20 degrees abduction, and hip flexion is normal. The patient has difficulty with certain motions because of pain. X-rays revealed avascular necrosis of the left femoral head with the collapsing femoral head from the previous surgery.   HOSPITAL COURSE: After initial  admission on 09/13/2013, the patient was brought to the orthopedic floor. On postoperative day one, the patient had a hemoglobin of 11.1, on postoperative day 2 it was down to 10.9. The patient ambulated with physical therapy 100 feet on postoperative day 1 and had a bowel movement on postoperative day 2. The patient was ready to go home on postoperative day 2 based on his improvement since surgery.   CONDITION AT DISCHARGE: Stable.   DISPOSITION: The patient was sent home with home health physical therapy.   DISCHARGE INSTRUCTIONS: The patient follow-up with [facility] in 2 weeks for staple removal. The patient will do weight-bear as tolerated on the affected leg. The patient will use 1 to 2 pillows under the affected leg to decrease swelling. The patient will use knee-high TED hose on both legs, removed at bedtime, and then put on again in the morning. The patient will elevate his heels and use incentive spirometer and be encouraged to do cough and deep breathing. The patient will do a regular diet. The patient will try not to get his dressing dirty and try to leave it on for now. The patient will have a dressing change as needed with physical therapy. The patient will do home health physical therapy doing gait training and range of motion activities, as well as work on strengthening.   DISCHARGE MEDICATIONS: Resume home medication and to add oxycodone 5 mg 1 tablet q. 4 hours for severe pain, tramadol 50 mg 1 tablet q. 4-6 hours p.r.n. for less severe pain, and Lovenox 40 mg subcutaneous for 14 days, then discontinue and  begin on aspirin 81 mg once a day.     ____________________________ J. Reche Dixon, [place] jtm:at D: 09/15/2013 06:37:22 ET T: 09/15/2013 13:12:17 ET JOB#: [id number]  cc: J. Reche Dixon, [place], <Dictator> J Alie Moudy [facility] ELECTRONICALLY SIGNED 09/25/2013 8:41

## 2014-08-23 ENCOUNTER — Encounter: Payer: Self-pay | Admitting: General Practice

## 2014-08-23 ENCOUNTER — Emergency Department
Admission: EM | Admit: 2014-08-23 | Discharge: 2014-08-23 | Discharge status: Against Medical Advice | Payer: Medicare Other | Attending: Emergency Medicine | Admitting: Emergency Medicine

## 2014-08-23 ENCOUNTER — Emergency Department: Payer: Medicare Other

## 2014-08-23 DIAGNOSIS — I10 Essential (primary) hypertension: Secondary | ICD-10-CM | POA: Insufficient documentation

## 2014-08-23 DIAGNOSIS — Y998 Other external cause status: Secondary | ICD-10-CM | POA: Diagnosis not present

## 2014-08-23 DIAGNOSIS — Y9289 Other specified places as the place of occurrence of the external cause: Secondary | ICD-10-CM | POA: Diagnosis not present

## 2014-08-23 DIAGNOSIS — S0081XA Abrasion of other part of head, initial encounter: Secondary | ICD-10-CM | POA: Insufficient documentation

## 2014-08-23 DIAGNOSIS — Y9389 Activity, other specified: Secondary | ICD-10-CM | POA: Insufficient documentation

## 2014-08-23 DIAGNOSIS — W01198A Fall on same level from slipping, tripping and stumbling with subsequent striking against other object, initial encounter: Secondary | ICD-10-CM | POA: Diagnosis not present

## 2014-08-23 DIAGNOSIS — S0990XA Unspecified injury of head, initial encounter: Secondary | ICD-10-CM | POA: Diagnosis present

## 2014-08-23 DIAGNOSIS — S60221A Contusion of right hand, initial encounter: Secondary | ICD-10-CM | POA: Diagnosis not present

## 2014-08-23 HISTORY — DX: Essential (primary) hypertension: I10

## 2014-08-23 NOTE — ED Notes (Addendum)
Pt to ED via EMS c/o call and striking head on floor. Pt denies any LOC. Pt presents with noted abrasion to forehead and hematoma to R hand.

## 2014-08-24 ENCOUNTER — Encounter: Payer: Self-pay | Admitting: Emergency Medicine

## 2014-08-24 ENCOUNTER — Emergency Department: Payer: Medicare Other

## 2014-08-24 ENCOUNTER — Emergency Department
Admission: EM | Admit: 2014-08-24 | Discharge: 2014-08-24 | Disposition: A | Discharge status: Home / Self Care | Payer: Medicare Other | Attending: Emergency Medicine | Admitting: Emergency Medicine

## 2014-08-24 DIAGNOSIS — S60221A Contusion of right hand, initial encounter: Secondary | ICD-10-CM | POA: Diagnosis not present

## 2014-08-24 DIAGNOSIS — Y998 Other external cause status: Secondary | ICD-10-CM | POA: Insufficient documentation

## 2014-08-24 DIAGNOSIS — S0003XA Contusion of scalp, initial encounter: Secondary | ICD-10-CM

## 2014-08-24 DIAGNOSIS — I10 Essential (primary) hypertension: Secondary | ICD-10-CM | POA: Insufficient documentation

## 2014-08-24 DIAGNOSIS — W01198A Fall on same level from slipping, tripping and stumbling with subsequent striking against other object, initial encounter: Secondary | ICD-10-CM | POA: Diagnosis not present

## 2014-08-24 DIAGNOSIS — Z23 Encounter for immunization: Secondary | ICD-10-CM | POA: Insufficient documentation

## 2014-08-24 DIAGNOSIS — Y9289 Other specified places as the place of occurrence of the external cause: Secondary | ICD-10-CM | POA: Insufficient documentation

## 2014-08-24 DIAGNOSIS — Y9389 Activity, other specified: Secondary | ICD-10-CM | POA: Insufficient documentation

## 2014-08-24 DIAGNOSIS — S0181XA Laceration without foreign body of other part of head, initial encounter: Secondary | ICD-10-CM | POA: Insufficient documentation

## 2014-08-24 DIAGNOSIS — S0990XA Unspecified injury of head, initial encounter: Secondary | ICD-10-CM | POA: Diagnosis present

## 2014-08-24 HISTORY — DX: Acute poliomyelitis, unspecified: A80.9

## 2014-08-24 MED ORDER — TETANUS-DIPHTH-ACELL PERTUSSIS 5-2.5-18.5 LF-MCG/0.5 IM SUSP
0.5000 mL | Freq: Once | INTRAMUSCULAR | Status: AC
  Administered 2014-08-24: 0.5 mL via INTRAMUSCULAR
  Filled 2014-08-24: qty 0.5

## 2014-08-24 MED ORDER — CEPHALEXIN 500 MG PO CAPS: 500.0000 mg | ORAL_CAPSULE | Freq: Three times a day (TID) | ORAL | Status: DC

## 2014-08-24 NOTE — ED Notes (Signed)
States came in last night and was triaged but left due to lengthy weight. States fell approx 9pm. Denies dizzyness prior to or LOC after fall. States is returning now to see provider. Laceration forehead noted with gauze over. No bleeding. Denies nausea, minimal pain.

## 2014-08-24 NOTE — ED Provider Notes (Signed)
[facility] Emergency Department Provider Note  ____________________________________________  Time seen:  10:38 AM  I have reviewed the triage vital signs and the nursing notes.   HISTORY  Chief Complaint Fall   HPI [name] [name] is a 69 y.o. male states he had a mechanical fall last evening approximately 9:15 or 9:30 PM. He was brought to the emergency room by EMS due to the difficulty his wife was having getting him off the floor. Patient states he hit his head on the floor. He has an injury to his right hand and also a laceration to his left forehead. He arrived to the emergency room last evening and decided to leave without being seen due to the lengthy wait in the emergency room. He is now here to have his right hand and evaluated and also because the nurse last night told him he needed stitches. He is unsure of his last tetanus shot. He denies any visual changes, nausea vomiting, or dizziness. He denies any loss of consciousness during his fall. Currently his pain is 2/10      Past Medical History  Diagnosis Date  . Hypertension   . Polio     There are no active problems to display for this patient.   Past Surgical History  Procedure Laterality Date  . Joint replacement    . Femur surgery    . Tibia fracture surgery      Current Outpatient Rx  Name  Route  Sig  Dispense  Refill  . cephALEXin (KEFLEX) 500 MG capsule   Oral   Take 1 capsule (500 mg total) by mouth 3 (three) times daily.   21 capsule   0     Allergies Morphine and related  No family history on file.  Social History History  Substance Use Topics  . Smoking status: Never Smoker   . Smokeless tobacco: Not on file  . Alcohol Use: 9.6 oz/week    2 Glasses of wine, 14 Standard drinks or equivalent per week    Review of Systems Constitutional: No fever/chills Eyes: No visual changes. Cardiovascular: Denies chest pain. Respiratory: Denies shortness of  breath. Gastrointestinal: No abdominal pain.  No nausea, no vomiting.  No diarrhea.  No constipation. Genitourinary: Negative for dysuria. Musculoskeletal: Negative for back pain. Skin: Negative for rash. Positive for ecchymosis and edema right hand. Laceration left forehead. Neurological: Negative for headaches, focal weakness or numbness.  10-point ROS otherwise negative.  ____________________________________________   PHYSICAL EXAM:  VITAL SIGNS: ED Triage Vitals  Enc Vitals Group     BP 08/24/14 1008 163/92 mmHg     Pulse Rate 08/24/14 1008 61     Resp 08/24/14 1008 18     Temp 08/24/14 1008 98.4 F (36.9 C)     Temp Source 08/24/14 1008 Oral     SpO2 08/24/14 1008 99 %     Weight 08/24/14 1008 193 lb (87.544 kg)     Height 08/24/14 1008 5\' 11"  (1.803 m)     Head Cir --      Peak Flow --      Pain Score 08/24/14 1010 2     Pain Loc --      Pain Edu? --      Excl. in [place]? --     Constitutional: Alert and oriented. Well appearing and in no acute distress. Eyes: Conjunctivae are normal. PERRL. EOMI. Head: Atraumatic. Nose: No congestion/rhinnorhea. Neck: No stridor.  Cervical spine is nontender on palpation Cardiovascular: Normal  rate, regular rhythm. Grossly normal heart sounds.  Good peripheral circulation. Respiratory: Normal respiratory effort.  No retractions. Lungs CTAB. Gastrointestinal: Soft and nontender. No distention Musculoskeletal: No lower extremity tenderness nor edema.  No joint effusions. Neurologic:  Normal speech and language. No [place] focal neurologic deficits are appreciated. No gait instability. Patient is oriented 3 Skin:  Skin is warm, dry. No rash noted. There is a 3 cm old laceration to the left forehead without any active bleeding. Area has partially sealed itself and there is no signs of infection at this time. Psychiatric: Mood and affect are normal. Speech and behavior are normal.  ____________________________________________    LABS (all labs ordered are listed, but only abnormal results are displayed)  Labs Reviewed - No data to display RADIOLOGY  Right hand x-ray per radiologist and reviewed by me showed multiple areas of degenerative disease but no acute fracture. I, Johnn Hai, personally viewed and evaluated these images as part of my medical decision making.  ____________________________________________   PROCEDURES  Procedure(s) performed: LACERATION REPAIR Performed by: Johnn Hai Authorized by: Johnn Hai Consent: Verbal consent obtained. Risks and benefits: risks, benefits and alternatives were discussed Consent given by: patient Patient identity confirmed: provided demographic data  Laceration Location: Left forehead  Laceration Length: 3 cm  No Foreign Bodies seen or palpated  Amount of cleaning: standard  Skin closure: With Steri-Strips and benzoin   Patient tolerance: Patient tolerated the procedure well with no immediate complications.  Critical Care performed: No  ____________________________________________   INITIAL IMPRESSION / ASSESSMENT AND PLAN / ED COURSE  Pertinent labs & imaging results that were available during my care of the patient were reviewed by me and considered in my medical decision making (see chart for details  Patient was placed on Keflex for 7 days to the fact that the laceration was approximately 12 hours old when he returned for evaluation. Area was cleaned with normal saline and Steri-Strips were placed. Patient and wife were instructed on watching for signs of infection. Ace wrap was placed around his right hand and he is to elevate and ice as needed for swelling. Patient will return to the emergency room if any severe worsening of his symptoms or signs of infection at his laceration site.   FINAL CLINICAL IMPRESSION(S) / ED DIAGNOSES  Final diagnoses:  Contusion of scalp, initial encounter  Laceration of forehead, initial  encounter  Contusion, hand, right, initial encounter      Johnn Hai, PA-C 08/24/14 [place], MD 08/24/14 [phone]

## 2014-08-24 NOTE — ED Notes (Signed)
Mechanical fall last night, denies LOC. Came to ER and left because of wait time. Right hand bruising, movement but painful. Laceration to forehead, covered with gauze, no bleeding.

## 2014-08-24 NOTE — Discharge Instructions (Signed)
BEGIN TAKING KEFLEX TODAY FOR 7 DAYS WATCH FOR SIGNS OF INFECTION AND RETURN TO ER OR SEE YOUR REGULAR DOCTOR IF ANY PROBLEMS  TYLENOL IF NEEDED FOR PAIN

## 2014-08-24 NOTE — ED Notes (Signed)
AAOx3.  Skin warm and dry.  NAD.  D/C home 

## 2015-02-05 DIAGNOSIS — X32XXXA Exposure to sunlight, initial encounter: Secondary | ICD-10-CM | POA: Diagnosis not present

## 2015-02-05 DIAGNOSIS — L608 Other nail disorders: Secondary | ICD-10-CM | POA: Diagnosis not present

## 2015-02-05 DIAGNOSIS — Z85828 Personal history of other malignant neoplasm of skin: Secondary | ICD-10-CM | POA: Diagnosis not present

## 2015-02-05 DIAGNOSIS — C44311 Basal cell carcinoma of skin of nose: Secondary | ICD-10-CM | POA: Diagnosis not present

## 2015-02-05 DIAGNOSIS — D225 Melanocytic nevi of trunk: Secondary | ICD-10-CM | POA: Diagnosis not present

## 2015-02-05 DIAGNOSIS — L570 Actinic keratosis: Secondary | ICD-10-CM | POA: Diagnosis not present

## 2015-02-05 DIAGNOSIS — D485 Neoplasm of uncertain behavior of skin: Secondary | ICD-10-CM | POA: Diagnosis not present

## 2015-02-05 DIAGNOSIS — D2262 Melanocytic nevi of left upper limb, including shoulder: Secondary | ICD-10-CM | POA: Diagnosis not present

## 2015-03-21 DIAGNOSIS — Z125 Encounter for screening for malignant neoplasm of prostate: Secondary | ICD-10-CM | POA: Diagnosis not present

## 2015-03-21 DIAGNOSIS — I10 Essential (primary) hypertension: Secondary | ICD-10-CM | POA: Diagnosis not present

## 2015-03-21 DIAGNOSIS — E785 Hyperlipidemia, unspecified: Secondary | ICD-10-CM | POA: Diagnosis not present

## 2015-04-17 DIAGNOSIS — C44311 Basal cell carcinoma of skin of nose: Secondary | ICD-10-CM | POA: Diagnosis not present

## 2015-05-02 DIAGNOSIS — K219 Gastro-esophageal reflux disease without esophagitis: Secondary | ICD-10-CM | POA: Diagnosis not present

## 2015-05-02 DIAGNOSIS — I10 Essential (primary) hypertension: Secondary | ICD-10-CM | POA: Diagnosis not present

## 2015-05-02 DIAGNOSIS — E785 Hyperlipidemia, unspecified: Secondary | ICD-10-CM | POA: Diagnosis not present

## 2015-05-02 DIAGNOSIS — F410 Panic disorder [episodic paroxysmal anxiety] without agoraphobia: Secondary | ICD-10-CM | POA: Diagnosis not present

## 2015-07-08 DIAGNOSIS — X32XXXA Exposure to sunlight, initial encounter: Secondary | ICD-10-CM | POA: Diagnosis not present

## 2015-07-08 DIAGNOSIS — D2261 Melanocytic nevi of right upper limb, including shoulder: Secondary | ICD-10-CM | POA: Diagnosis not present

## 2015-07-08 DIAGNOSIS — Z85828 Personal history of other malignant neoplasm of skin: Secondary | ICD-10-CM | POA: Diagnosis not present

## 2015-07-08 DIAGNOSIS — D225 Melanocytic nevi of trunk: Secondary | ICD-10-CM | POA: Diagnosis not present

## 2015-07-08 DIAGNOSIS — L570 Actinic keratosis: Secondary | ICD-10-CM | POA: Diagnosis not present

## 2015-07-08 DIAGNOSIS — D2262 Melanocytic nevi of left upper limb, including shoulder: Secondary | ICD-10-CM | POA: Diagnosis not present

## 2015-10-01 IMAGING — CR PELVIS - 1-2 VIEW
1 series · 2 of 2 positions shown · non-contrast
Comparison: Left hip radiographs dated 09/13/2013

CLINICAL DATA: Fall, left hip pain

EXAM:
PELVIS - 1-2 VIEW

[Series 1: dxr pelvis ap only · 0.14mm/px · 2 of 2 slices shown]
[im 1/2]
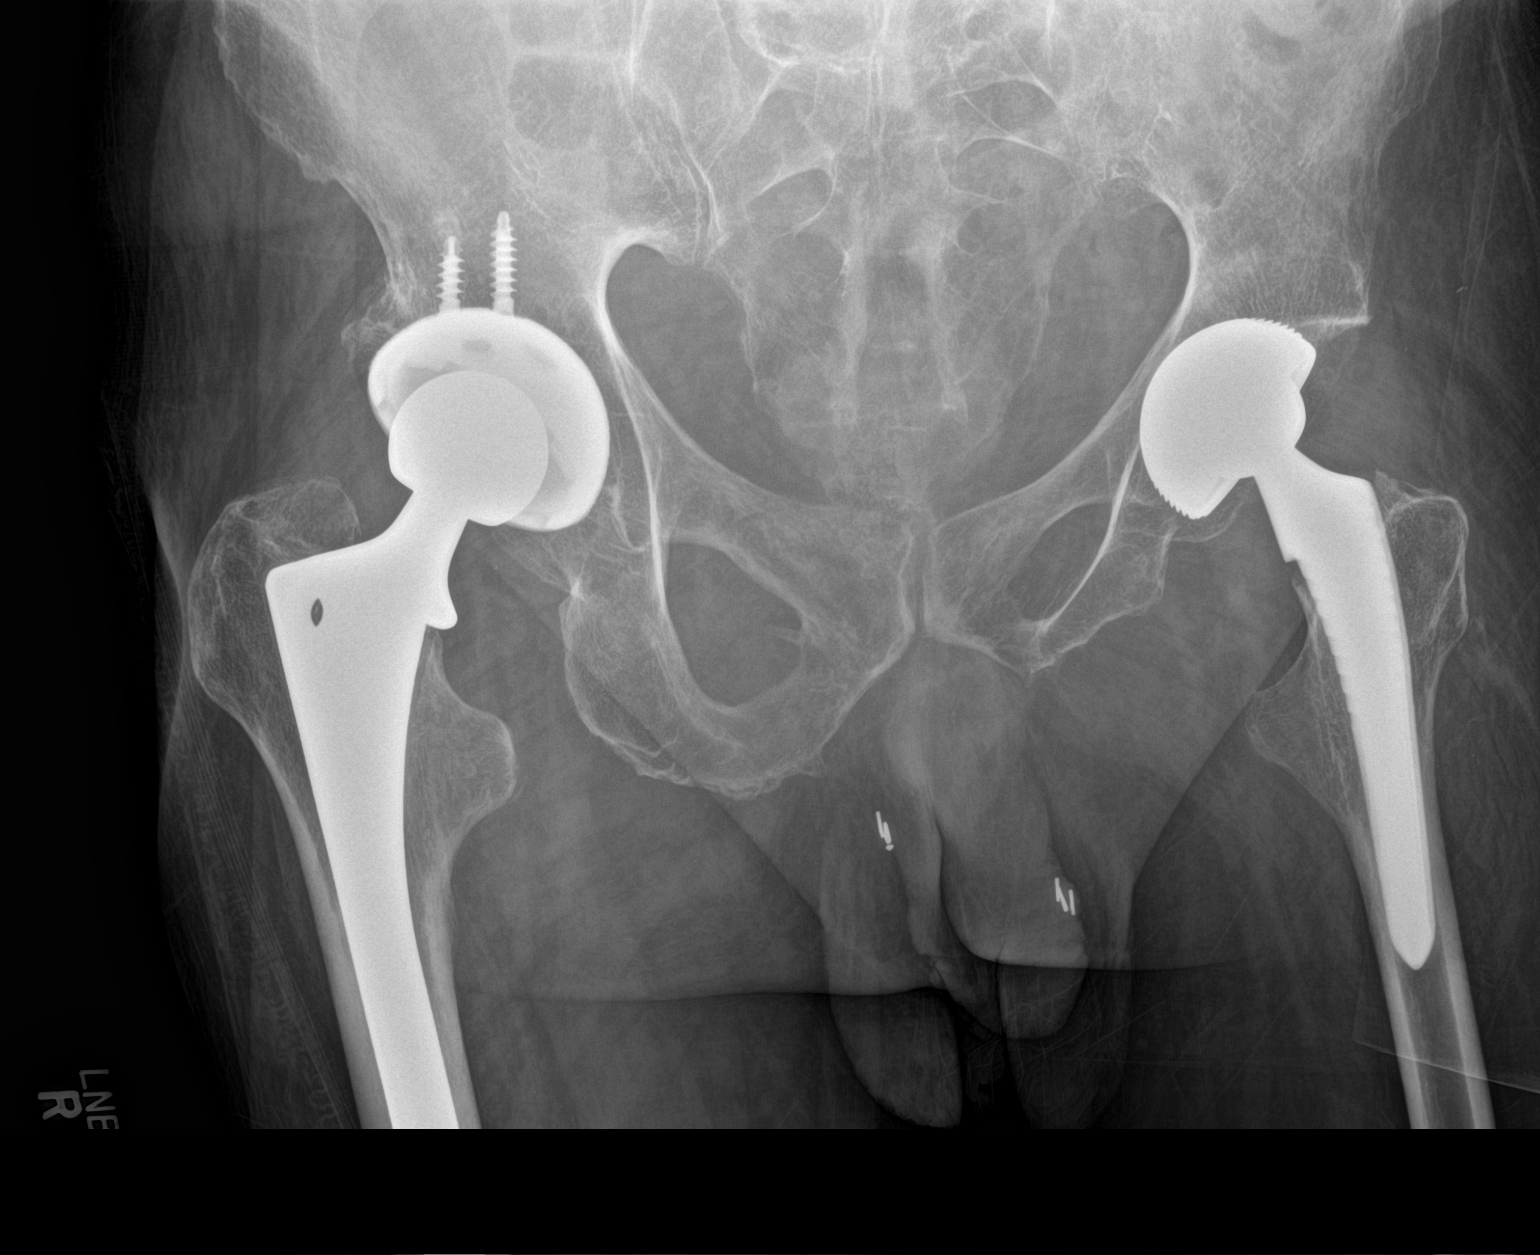
[im 2/2]
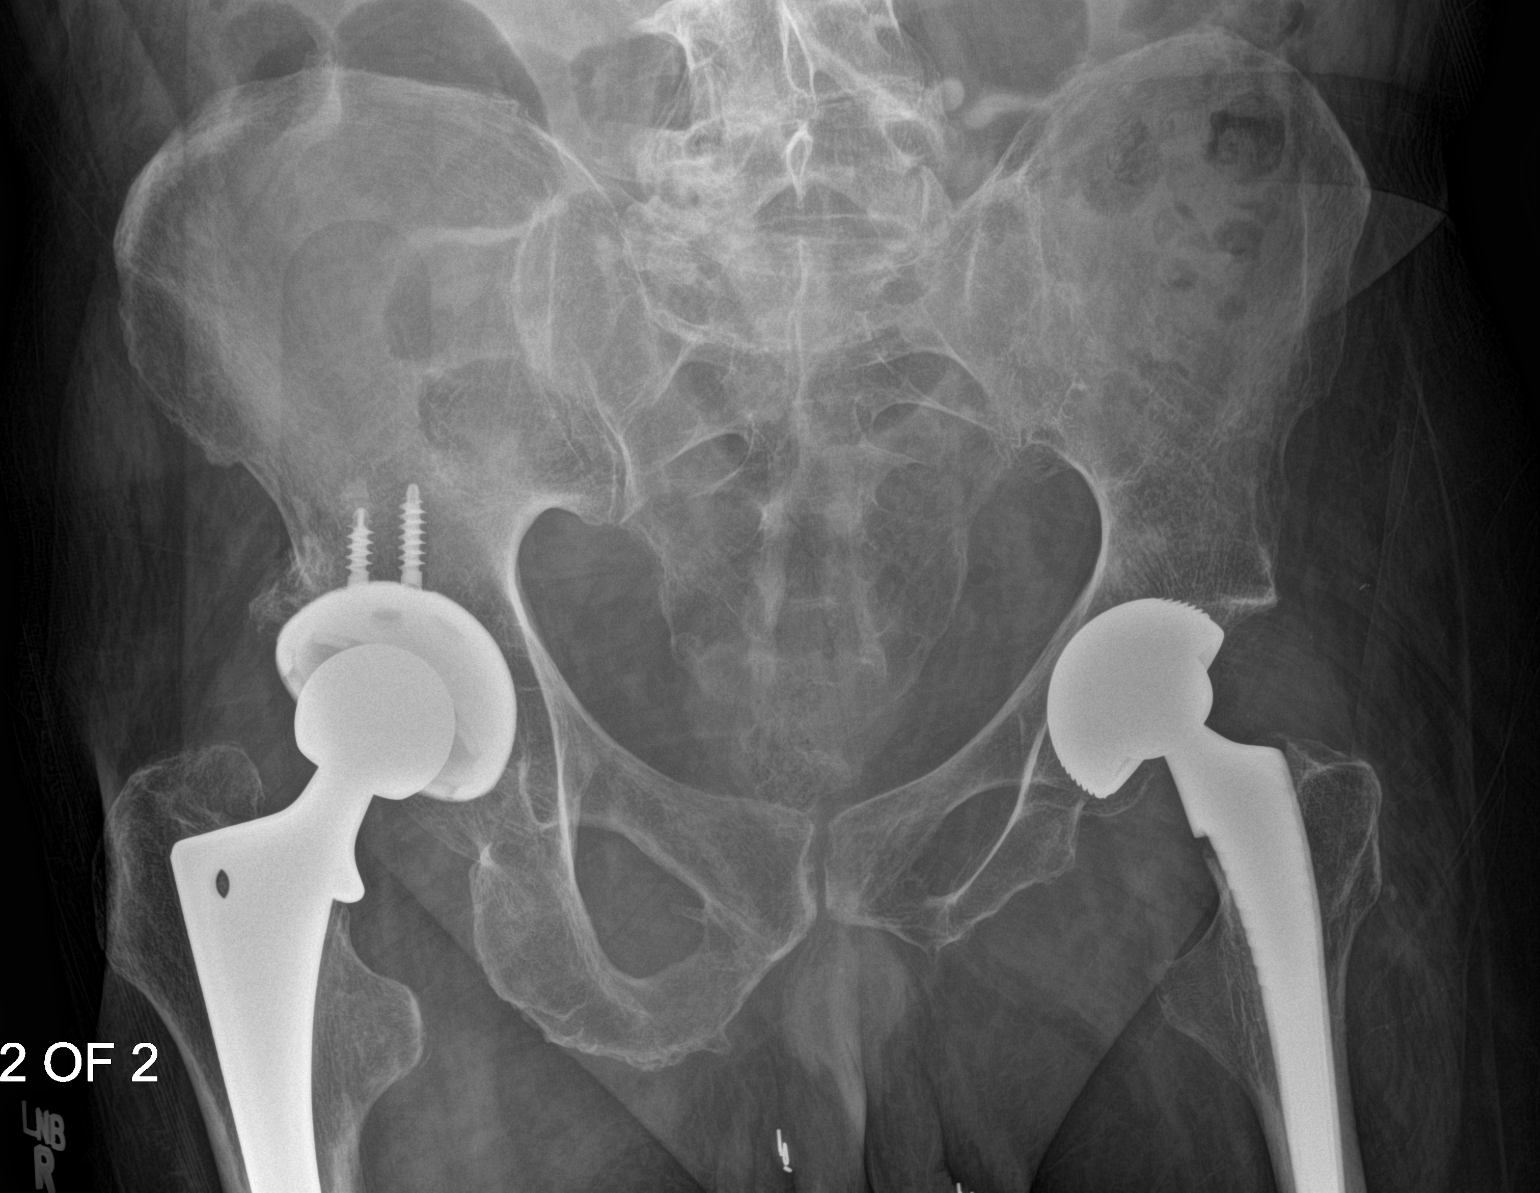

[2 of 2 positions shown; findings below may reference images not displayed]

FINDINGS: Bilateral total hip arthroplasties.

No evidence of complication.

Visualized bony pelvis appears intact.

Degenerative changes of the lower lumbar spine.
IMPRESSION: Bilateral total hip arthroplasties.

No fracture or dislocation is seen.

## 2015-10-01 IMAGING — CT CT EXTREM LOW W/O CM*L*
2 of 3 series · 13 of 46 positions shown, 15 images · non-contrast
Comparison: Radiographs dated 12/30/2013

CLINICAL DATA: Left hip pain secondary to a fall last night at
home. Bilateral total hip prostheses.

EXAM:
CT OF THE LEFT THIGH WITHOUT CONTRAST
TECHNIQUE: Multidetector CT imaging of the left thigh was performed according
to the standard protocol.

[Series 6: soft tissue axials · axial · 0.45mm/px · z∈[-110,+358]mm · 10 of 270 slices shown, 12 images]
[im 18/270  soft-tissue]
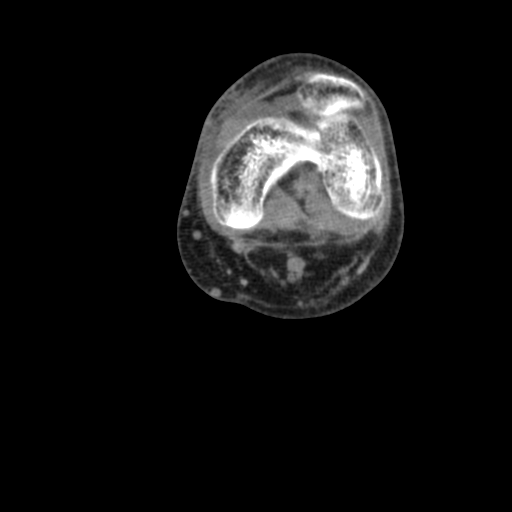
[im 18/270  bone]
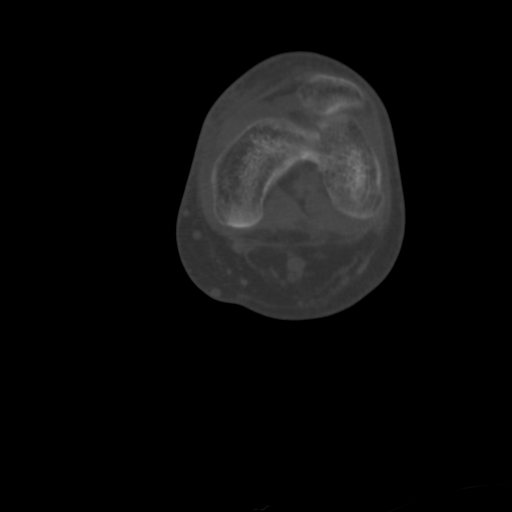
[im 44/270  soft-tissue]
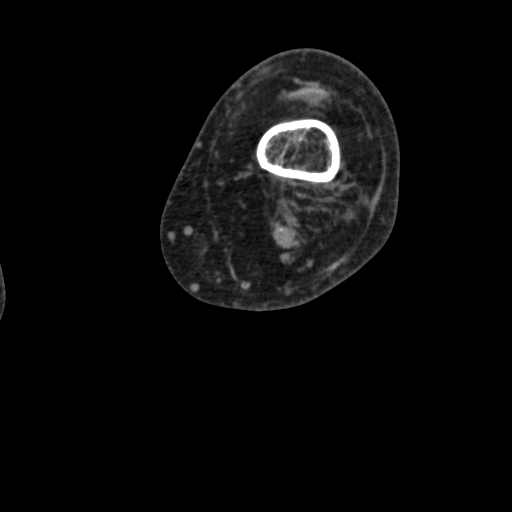
[im 70/270  soft-tissue]
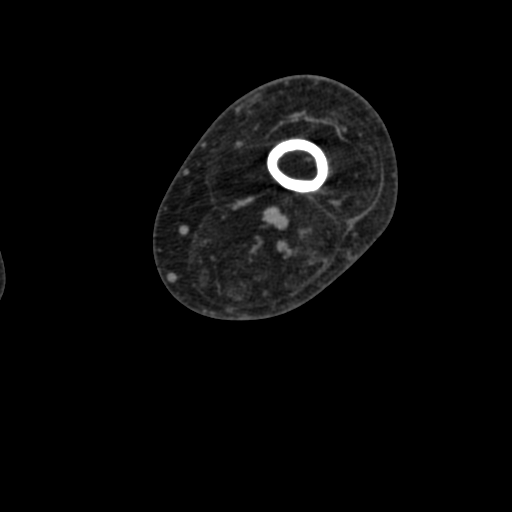
[im 96/270  soft-tissue]
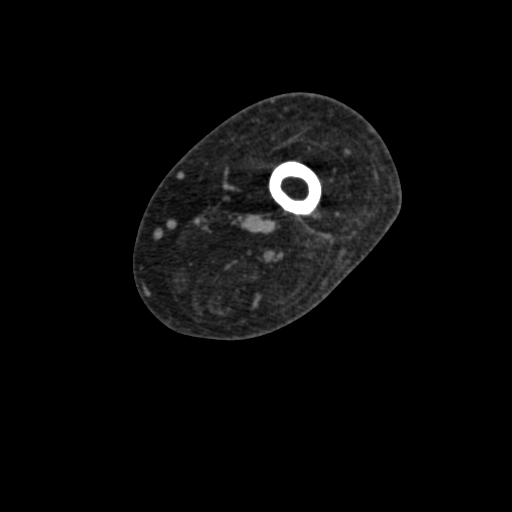
[im 122/270  soft-tissue]
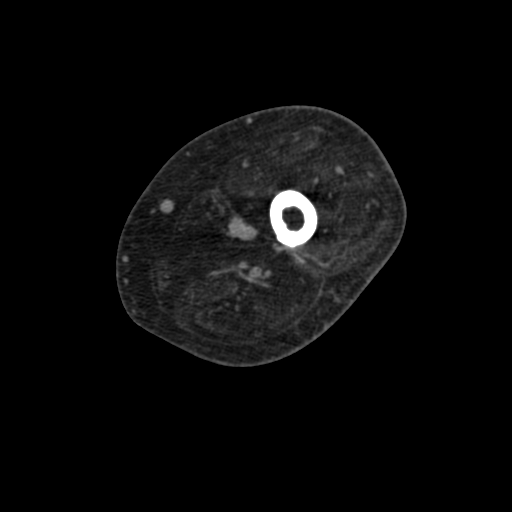
[im 148/270  soft-tissue]
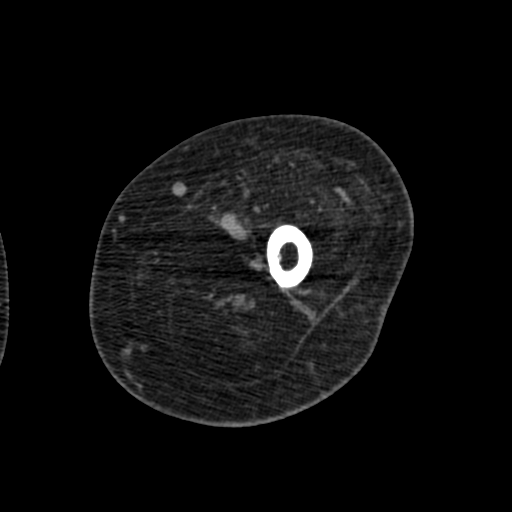
[im 174/270  soft-tissue]
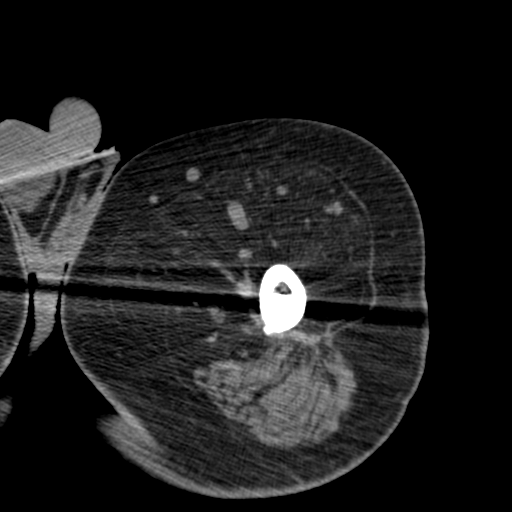
[im 200/270  soft-tissue]
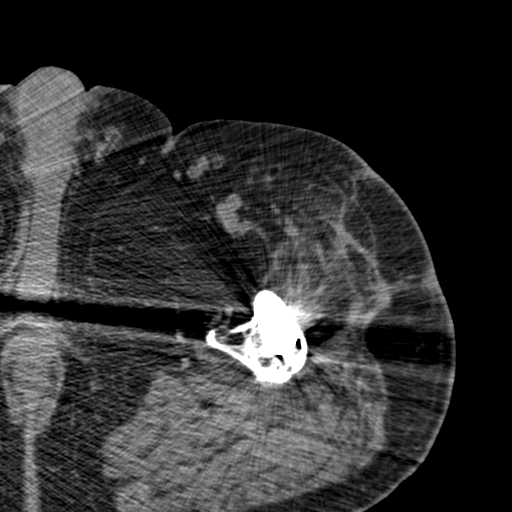
[im 226/270  soft-tissue]
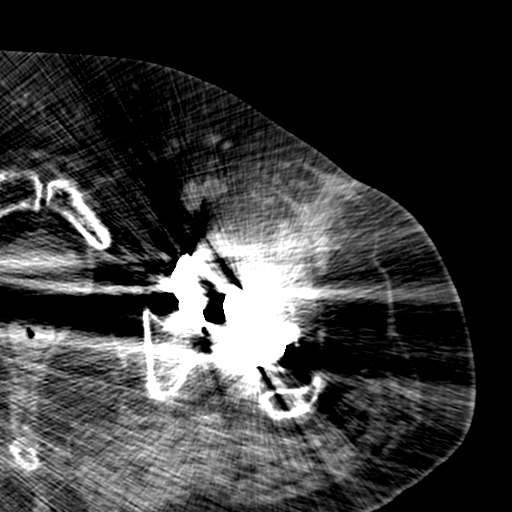
[im 226/270  bone]
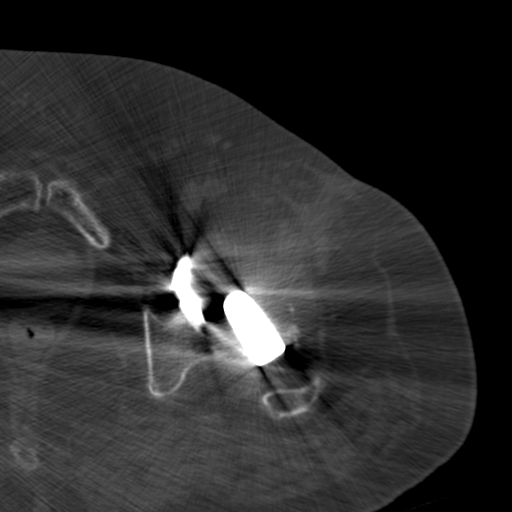
[im 252/270  soft-tissue]
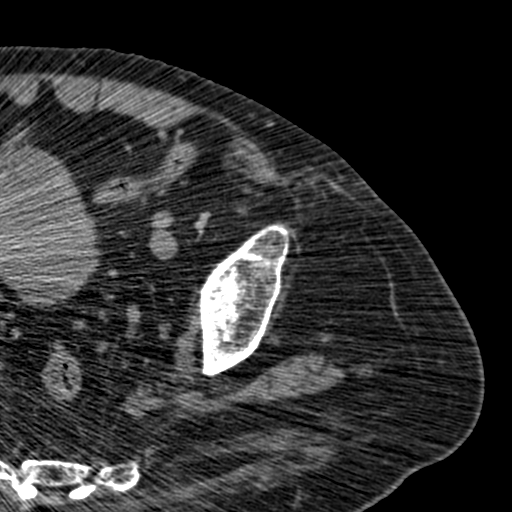

[Series 8: coronal soft tissue · coronal · 0.42mm/px · 3 of 98 slices shown]
[im 33/98  soft-tissue]
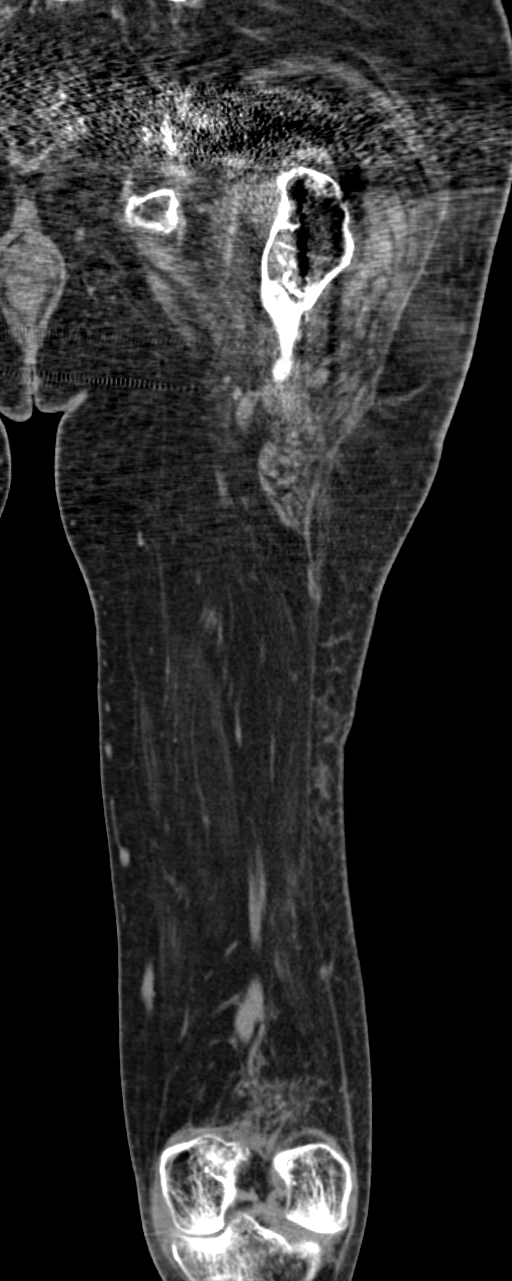
[im 44/98  soft-tissue]
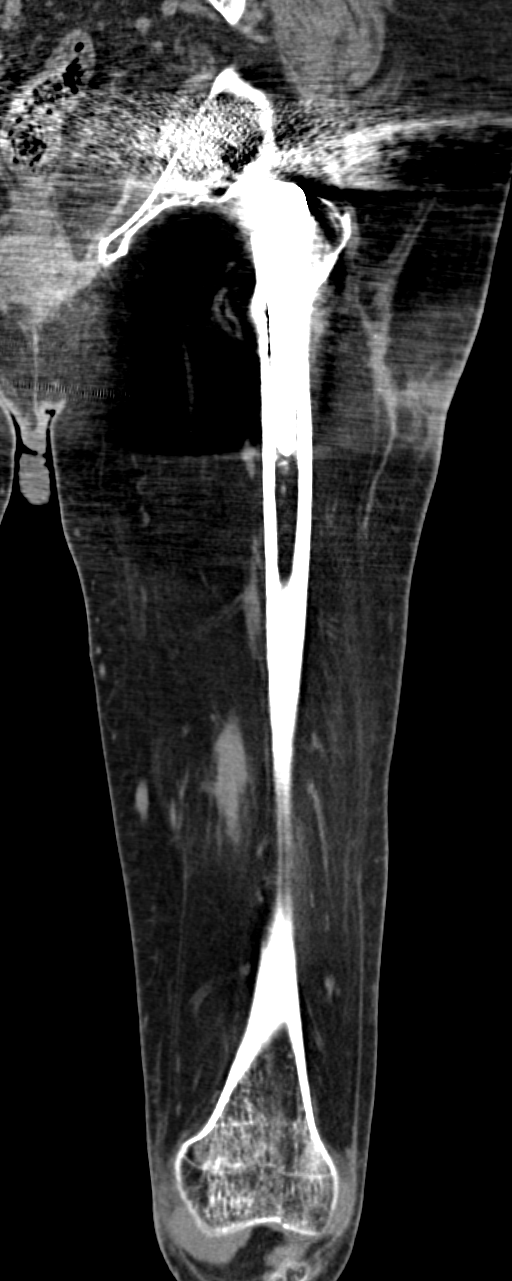
[im 54/98  soft-tissue]
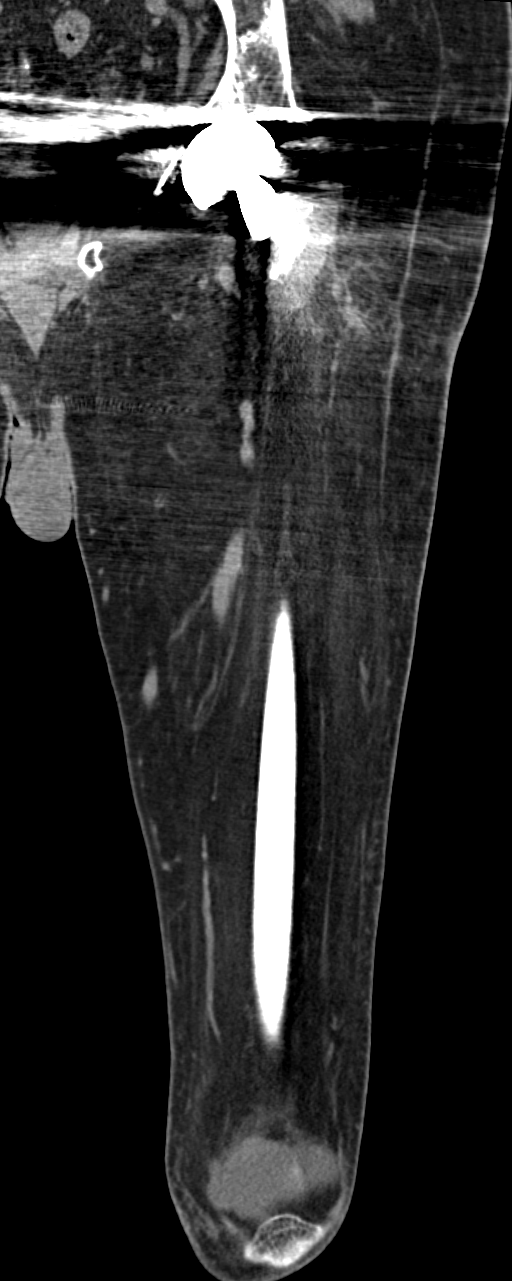

[13 of 46 positions shown; findings below may reference images not displayed]

FINDINGS: There is a nondisplaced hairline spiral fracture of the distal
femoral shaft beginning approximately 12 cm above the joint space.
The fracture extends in a sagittal plane distally through the
intercondylar notch into the medial aspect of the lateral femoral
condyle.

There is a component of the fracture which is sagittal-oblique
extending through the medial femoral condyle and extending through
the cortex of the medial aspect of the distal metaphysis.

There is a hemarthrosis at the knee.

The proximal femur is intact. Hip prosthesis is in place with no
dislocation or evidence of loosening. The visualized pelvic bones
are normal.
IMPRESSION: Complex fracture of the distal left femoral shaft and femoral
condyles. No displacement.

## 2015-10-10 DIAGNOSIS — H6121 Impacted cerumen, right ear: Secondary | ICD-10-CM | POA: Diagnosis not present

## 2015-11-13 DIAGNOSIS — E785 Hyperlipidemia, unspecified: Secondary | ICD-10-CM | POA: Diagnosis not present

## 2015-11-13 DIAGNOSIS — G14 Postpolio syndrome: Secondary | ICD-10-CM | POA: Diagnosis not present

## 2015-11-13 DIAGNOSIS — I10 Essential (primary) hypertension: Secondary | ICD-10-CM | POA: Diagnosis not present

## 2015-11-13 DIAGNOSIS — Z125 Encounter for screening for malignant neoplasm of prostate: Secondary | ICD-10-CM | POA: Diagnosis not present

## 2015-11-13 DIAGNOSIS — F410 Panic disorder [episodic paroxysmal anxiety] without agoraphobia: Secondary | ICD-10-CM | POA: Diagnosis not present

## 2015-11-13 DIAGNOSIS — K219 Gastro-esophageal reflux disease without esophagitis: Secondary | ICD-10-CM | POA: Diagnosis not present

## 2015-11-21 DIAGNOSIS — H2513 Age-related nuclear cataract, bilateral: Secondary | ICD-10-CM | POA: Diagnosis not present

## 2016-01-12 DIAGNOSIS — L570 Actinic keratosis: Secondary | ICD-10-CM | POA: Diagnosis not present

## 2016-01-12 DIAGNOSIS — X32XXXA Exposure to sunlight, initial encounter: Secondary | ICD-10-CM | POA: Diagnosis not present

## 2016-01-12 DIAGNOSIS — Z85828 Personal history of other malignant neoplasm of skin: Secondary | ICD-10-CM | POA: Diagnosis not present

## 2016-01-12 DIAGNOSIS — D225 Melanocytic nevi of trunk: Secondary | ICD-10-CM | POA: Diagnosis not present

## 2016-01-12 DIAGNOSIS — D2262 Melanocytic nevi of left upper limb, including shoulder: Secondary | ICD-10-CM | POA: Diagnosis not present

## 2016-01-12 DIAGNOSIS — D2271 Melanocytic nevi of right lower limb, including hip: Secondary | ICD-10-CM | POA: Diagnosis not present

## 2016-05-06 DIAGNOSIS — Z125 Encounter for screening for malignant neoplasm of prostate: Secondary | ICD-10-CM | POA: Diagnosis not present

## 2016-05-06 DIAGNOSIS — I10 Essential (primary) hypertension: Secondary | ICD-10-CM | POA: Diagnosis not present

## 2016-05-06 DIAGNOSIS — E785 Hyperlipidemia, unspecified: Secondary | ICD-10-CM | POA: Diagnosis not present

## 2016-05-06 DIAGNOSIS — K219 Gastro-esophageal reflux disease without esophagitis: Secondary | ICD-10-CM | POA: Diagnosis not present

## 2016-05-07 DIAGNOSIS — H2513 Age-related nuclear cataract, bilateral: Secondary | ICD-10-CM | POA: Diagnosis not present

## 2016-05-14 DIAGNOSIS — R972 Elevated prostate specific antigen [PSA]: Secondary | ICD-10-CM | POA: Diagnosis not present

## 2016-05-14 DIAGNOSIS — E538 Deficiency of other specified B group vitamins: Secondary | ICD-10-CM | POA: Diagnosis not present

## 2016-05-14 DIAGNOSIS — E785 Hyperlipidemia, unspecified: Secondary | ICD-10-CM | POA: Diagnosis not present

## 2016-05-14 DIAGNOSIS — I10 Essential (primary) hypertension: Secondary | ICD-10-CM | POA: Diagnosis not present

## 2016-05-14 DIAGNOSIS — K219 Gastro-esophageal reflux disease without esophagitis: Secondary | ICD-10-CM | POA: Diagnosis not present

## 2016-05-14 DIAGNOSIS — F410 Panic disorder [episodic paroxysmal anxiety] without agoraphobia: Secondary | ICD-10-CM | POA: Diagnosis not present

## 2016-05-25 IMAGING — CR DG HAND COMPLETE 3+V*R*
1 series · 3 of 3 positions shown · non-contrast
Comparison: None.

CLINICAL DATA: Pain, swelling and bruising about the right hand
since a fall last night. Initial encounter.

EXAM:
RIGHT HAND - COMPLETE 3+ VIEW

[Series 1: pa · 0.17mm/px · 3 of 3 slices shown]
[im 1/3]
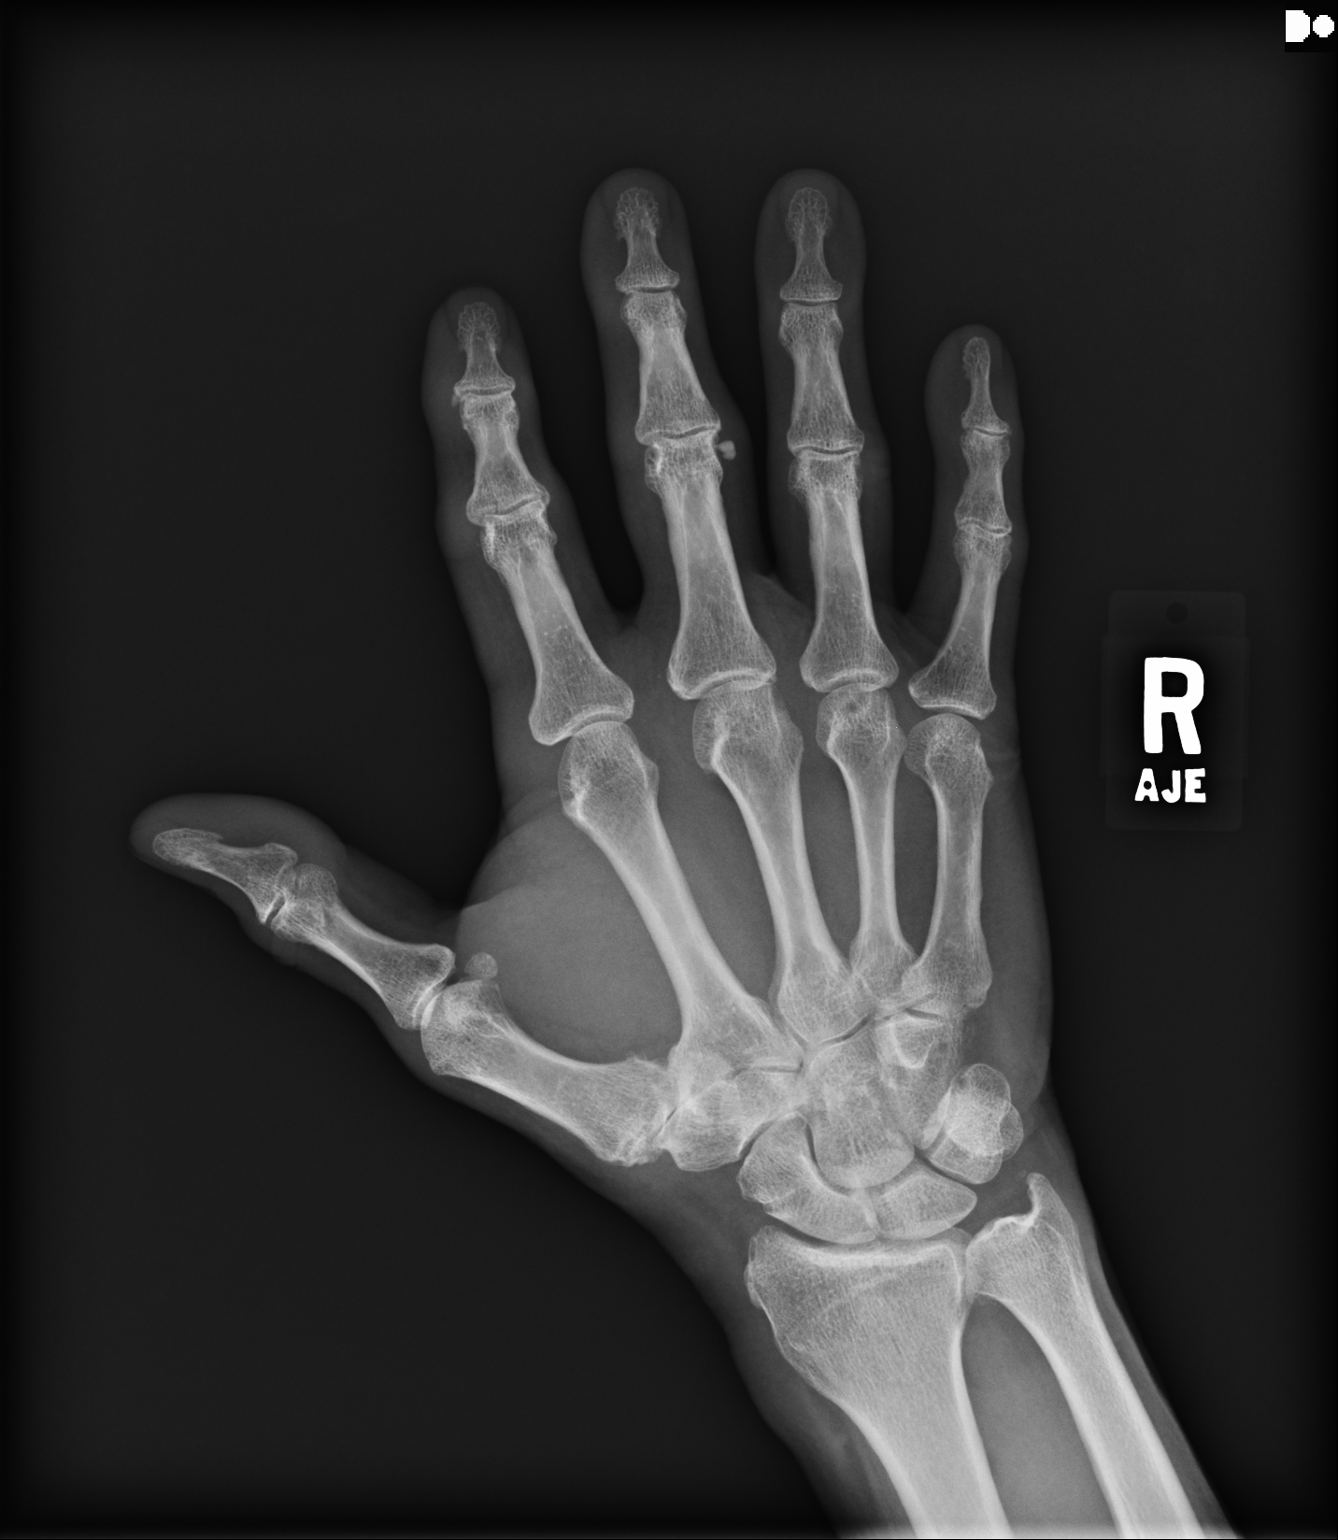
[im 2/3]
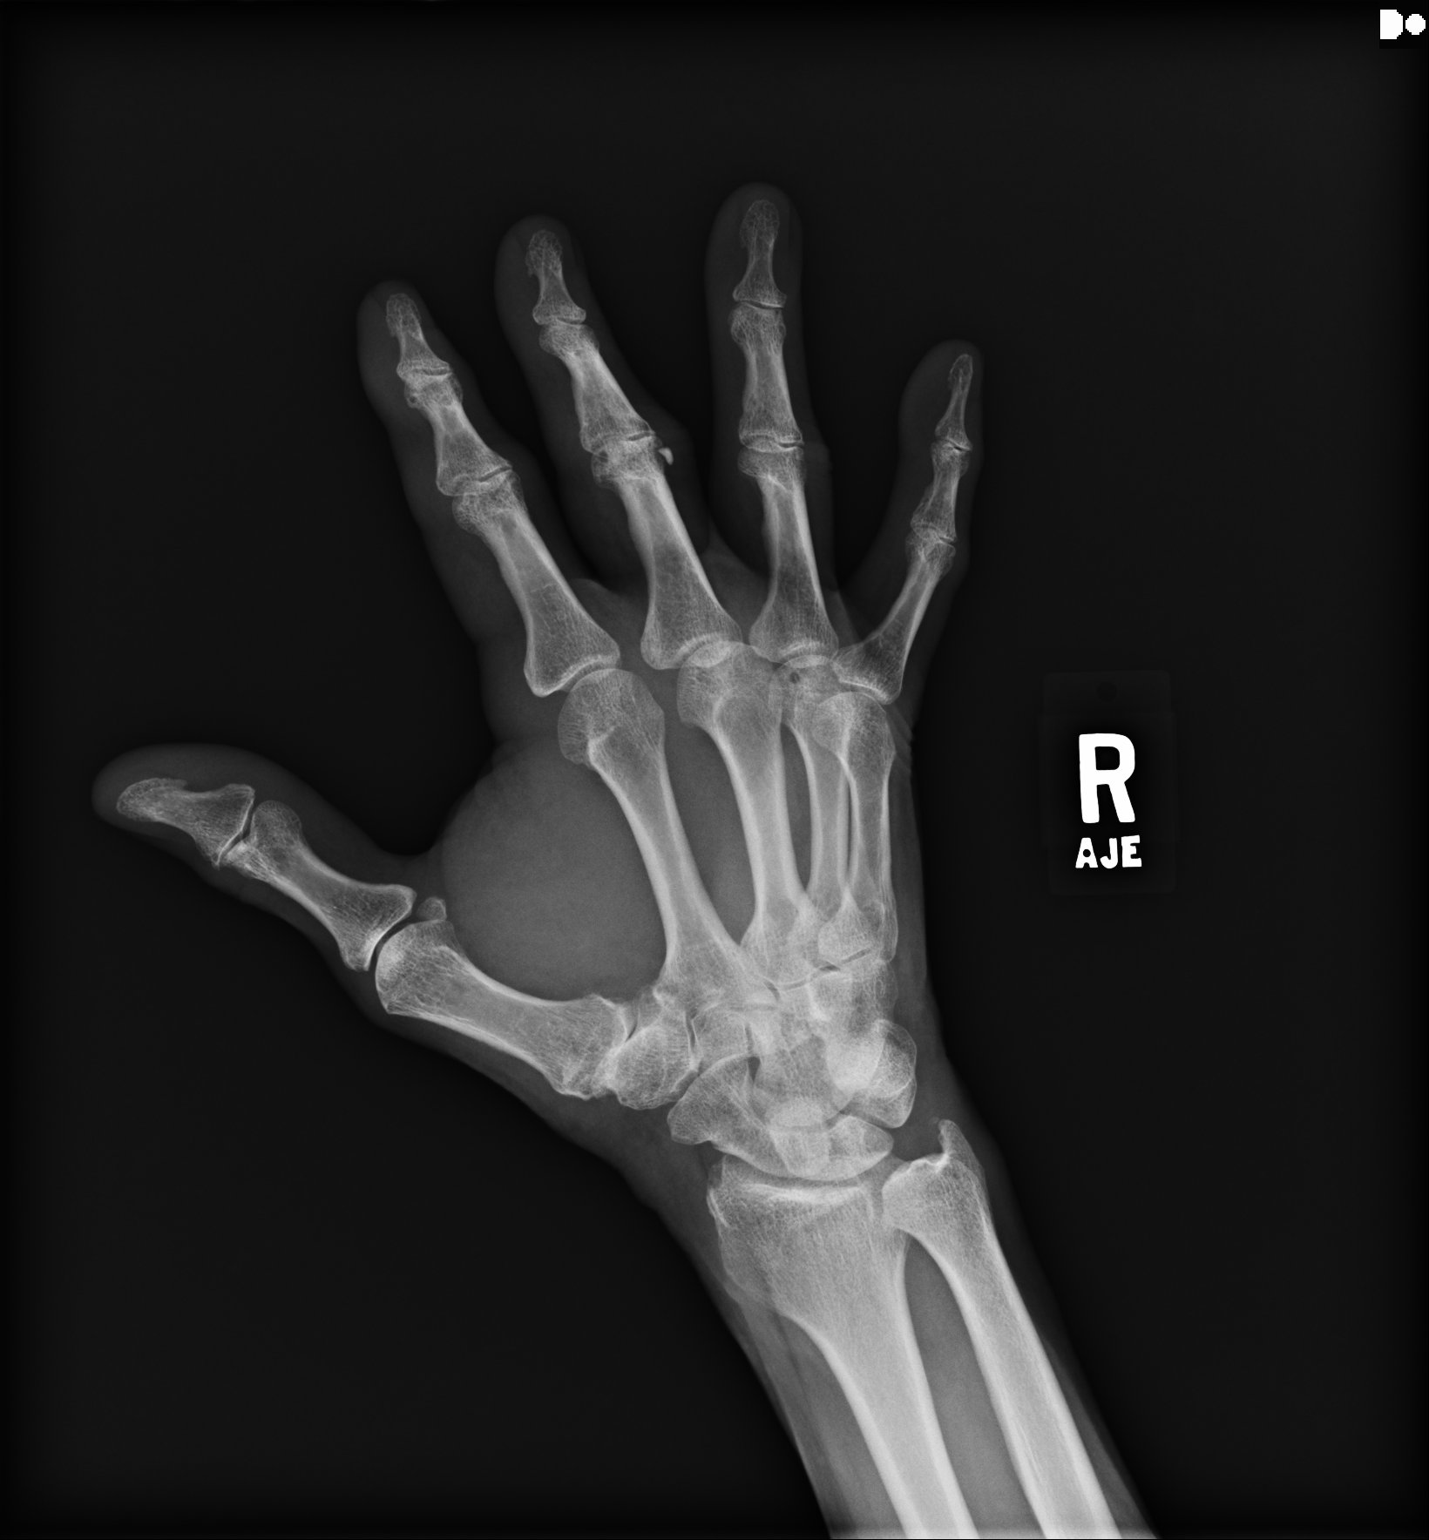
[im 3/3]
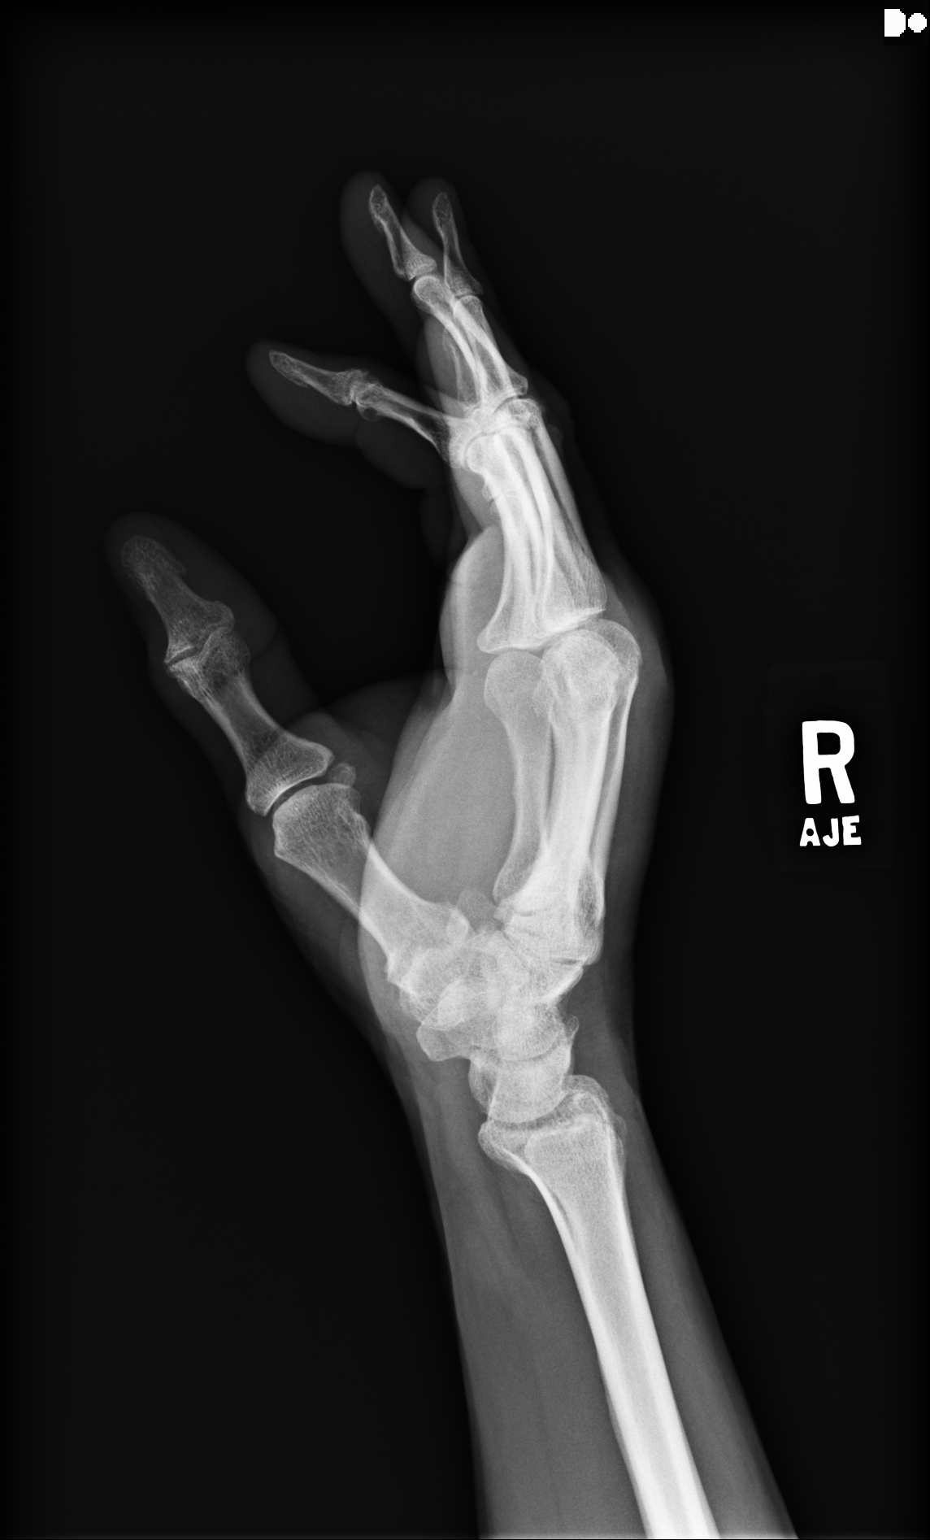

[3 of 3 positions shown; findings below may reference images not displayed]

FINDINGS: No acute bony or joint abnormality is identified. Scattered
degenerative disease appears most notable at the first CMC joint. A
calcification is identified along the head of the proximal phalanx
of the long finger on the ulnar side and likely secondary to some
prior injury. Soft tissues are unremarkable.
IMPRESSION: No acute abnormality.

Multifocal degenerative disease.

## 2016-06-03 DIAGNOSIS — K219 Gastro-esophageal reflux disease without esophagitis: Secondary | ICD-10-CM | POA: Diagnosis not present

## 2016-06-03 DIAGNOSIS — E538 Deficiency of other specified B group vitamins: Secondary | ICD-10-CM | POA: Diagnosis not present

## 2016-06-03 DIAGNOSIS — R972 Elevated prostate specific antigen [PSA]: Secondary | ICD-10-CM | POA: Diagnosis not present

## 2016-06-17 DIAGNOSIS — H2513 Age-related nuclear cataract, bilateral: Secondary | ICD-10-CM | POA: Diagnosis not present

## 2016-06-21 ENCOUNTER — Encounter: Payer: Self-pay | Admitting: *Deleted

## 2016-06-29 DIAGNOSIS — D72829 Elevated white blood cell count, unspecified: Secondary | ICD-10-CM | POA: Diagnosis not present

## 2016-06-29 NOTE — Discharge Instructions (Signed)
Cataract Surgery, Care After °Refer to this sheet in the next few weeks. These instructions provide you with information about caring for yourself after your procedure. Your health care provider may also give you more specific instructions. Your treatment has been planned according to current medical practices, but problems sometimes occur. Call your health care provider if you have any problems or questions after your procedure. °What can I expect after the procedure? °After the procedure, it is common to have: °· Itching. °· Discomfort. °· Fluid discharge. °· Sensitivity to light and to touch. °· Bruising. °Follow these instructions at home: °Eye Care  °· Check your eye every day for signs of infection. Watch for: °¨ Redness, swelling, or pain. °¨ Fluid, blood, or pus. °¨ Warmth. °¨ Bad smell. °Activity  °· Avoid strenuous activities, such as playing contact sports, for as long as told by your health care provider. °· Do not drive or operate heavy machinery until your health care provider approves. °· Do not bend or lift heavy objects . Bending increases pressure in the eye. You can walk, climb stairs, and do light household chores. °· Ask your health care provider when you can return to work. If you work in a dusty environment, you may be advised to wear protective eyewear for a period of time. °General instructions  °· Take or apply over-the-counter and prescription medicines only as told by your health care provider. This includes eye drops. °· Do not touch or rub your eyes. °· If you were given a protective shield, wear it as told by your health care provider. If you were not given a protective shield, wear sunglasses as told by your health care provider to protect your eyes. °· Keep the area around your eye clean and dry. Avoid swimming or allowing water to hit you directly in the face while showering until told by your health care provider. Keep soap and shampoo out of your eyes. °· Do not put a contact lens  into the affected eye or eyes until your health care provider approves. °· Keep all follow-up visits as told by your health care provider. This is important. °Contact a health care provider if: ° °· You have increased bruising around your eye. °· You have pain that is not helped with medicine. °· You have a fever. °· You have redness, swelling, or pain in your eye. °· You have fluid, blood, or pus coming from your incision. °· Your vision gets worse. °Get help right away if: °· You have sudden vision loss. °This information is not intended to replace advice given to you by your health care provider. Make sure you discuss any questions you have with your health care provider. °Document Released: 08/07/2004 Document Revised: 05/29/2015 Document Reviewed: 11/28/2014 °Elsevier Interactive Patient Education © 2017 [employer] ° ° ° ° °General Anesthesia, Adult, Care After °These instructions provide you with information about caring for yourself after your procedure. Your health care provider may also give you more specific instructions. Your treatment has been planned according to current medical practices, but problems sometimes occur. Call your health care provider if you have any problems or questions after your procedure. °What can I expect after the procedure? °After the procedure, it is common to have: °· Vomiting. °· A sore throat. °· Mental slowness. °It is common to feel: °· Nauseous. °· Cold or shivery. °· Sleepy. °· Tired. °· Sore or achy, even in parts of your body where you did not have surgery. °Follow these instructions at   home: °For at least 24 hours after the procedure:  °· Do not: °¨ Participate in activities where you could fall or become injured. °¨ Drive. °¨ Use heavy machinery. °¨ Drink alcohol. °¨ Take sleeping pills or medicines that cause drowsiness. °¨ Make important decisions or sign legal documents. °¨ Take care of children on your own. °· Rest. °Eating and drinking  °· If you vomit, drink  water, juice, or soup when you can drink without vomiting. °· Drink enough fluid to keep your urine clear or pale yellow. °· Make sure you have little or no nausea before eating solid foods. °· Follow the diet recommended by your health care provider. °General instructions  °· Have a responsible adult stay with you until you are awake and alert. °· Return to your normal activities as told by your health care provider. Ask your health care provider what activities are safe for you. °· Take over-the-counter and prescription medicines only as told by your health care provider. °· If you smoke, do not smoke without supervision. °· Keep all follow-up visits as told by your health care provider. This is important. °Contact a health care provider if: °· You continue to have nausea or vomiting at home, and medicines are not helpful. °· You cannot drink fluids or start eating again. °· You cannot urinate after 8-12 hours. °· You develop a skin rash. °· You have fever. °· You have increasing redness at the site of your procedure. °Get help right away if: °· You have difficulty breathing. °· You have chest pain. °· You have unexpected bleeding. °· You feel that you are having a life-threatening or urgent problem. °This information is not intended to replace advice given to you by your health care provider. Make sure you discuss any questions you have with your health care provider. °Document Released: 04/26/2000 Document Revised: 06/23/2015 Document Reviewed: 01/02/2015 °Elsevier Interactive Patient Education © 2017 [employer] ° °

## 2016-06-30 ENCOUNTER — Encounter
Admission: RE | Disposition: A | Discharge status: Home / Self Care | Payer: Self-pay | Source: Ambulatory Visit | Attending: Ophthalmology

## 2016-06-30 ENCOUNTER — Ambulatory Visit: Payer: PPO | Admitting: Anesthesiology

## 2016-06-30 ENCOUNTER — Ambulatory Visit
Admission: RE | Admit: 2016-06-30 | Discharge: 2016-06-30 | Disposition: A | Discharge status: Home / Self Care | Payer: PPO | Source: Ambulatory Visit | Attending: Ophthalmology | Admitting: Ophthalmology

## 2016-06-30 DIAGNOSIS — K219 Gastro-esophageal reflux disease without esophagitis: Secondary | ICD-10-CM | POA: Diagnosis not present

## 2016-06-30 DIAGNOSIS — Z79899 Other long term (current) drug therapy: Secondary | ICD-10-CM | POA: Diagnosis not present

## 2016-06-30 DIAGNOSIS — I10 Essential (primary) hypertension: Secondary | ICD-10-CM | POA: Insufficient documentation

## 2016-06-30 DIAGNOSIS — Z87891 Personal history of nicotine dependence: Secondary | ICD-10-CM | POA: Diagnosis not present

## 2016-06-30 DIAGNOSIS — H5703 Miosis: Secondary | ICD-10-CM | POA: Diagnosis not present

## 2016-06-30 DIAGNOSIS — H2512 Age-related nuclear cataract, left eye: Secondary | ICD-10-CM | POA: Diagnosis not present

## 2016-06-30 DIAGNOSIS — E7800 Pure hypercholesterolemia, unspecified: Secondary | ICD-10-CM | POA: Diagnosis not present

## 2016-06-30 DIAGNOSIS — Z7982 Long term (current) use of aspirin: Secondary | ICD-10-CM | POA: Diagnosis not present

## 2016-06-30 DIAGNOSIS — H2513 Age-related nuclear cataract, bilateral: Secondary | ICD-10-CM | POA: Diagnosis not present

## 2016-06-30 HISTORY — DX: Unspecified osteoarthritis, unspecified site: M19.90

## 2016-06-30 HISTORY — DX: Presence of external hearing-aid: Z97.4

## 2016-06-30 HISTORY — DX: Dependence on other enabling machines and devices: Z99.89

## 2016-06-30 HISTORY — DX: Gastro-esophageal reflux disease without esophagitis: K21.9

## 2016-06-30 HISTORY — DX: Deforming dorsopathy, unspecified: M43.9

## 2016-06-30 HISTORY — PX: CATARACT EXTRACTION W/PHACO: SHX586

## 2016-06-30 HISTORY — DX: Claustrophobia: F40.240

## 2016-06-30 HISTORY — DX: Other symptoms and signs involving the musculoskeletal system: R29.898

## 2016-06-30 SURGERY — PHACOEMULSIFICATION, CATARACT, WITH IOL INSERTION
Anesthesia: Monitor Anesthesia Care | Site: Eye | Laterality: Left | Wound class: Clean

## 2016-06-30 MED ORDER — MIDAZOLAM HCL 2 MG/2ML IJ SOLN
INTRAMUSCULAR | Status: DC | PRN
  Administered 2016-06-30: 2 mg via INTRAVENOUS

## 2016-06-30 MED ORDER — FENTANYL CITRATE (PF) 100 MCG/2ML IJ SOLN
INTRAMUSCULAR | Status: DC | PRN
  Administered 2016-06-30 (×2): 50 ug via INTRAVENOUS

## 2016-06-30 MED ORDER — ACETAMINOPHEN 160 MG/5ML PO SOLN: 325.0000 mg | ORAL | Status: DC | PRN

## 2016-06-30 MED ORDER — EPINEPHRINE PF 1 MG/ML IJ SOLN
INTRAOCULAR | Status: DC | PRN
  Administered 2016-06-30: 59 mL via OPHTHALMIC

## 2016-06-30 MED ORDER — ACETAMINOPHEN 325 MG PO TABS: 325.0000 mg | ORAL_TABLET | ORAL | Status: DC | PRN

## 2016-06-30 MED ORDER — MOXIFLOXACIN HCL 0.5 % OP SOLN
1.0000 [drp] | OPHTHALMIC | Status: DC | PRN
Start: 2016-06-30 — End: 2016-06-30
  Administered 2016-06-30 (×3): 1 [drp] via OPHTHALMIC

## 2016-06-30 MED ORDER — CEFUROXIME OPHTHALMIC INJECTION 1 MG/0.1 ML
INJECTION | OPHTHALMIC | Status: DC | PRN
Start: 2016-06-30 — End: 2016-06-30
  Administered 2016-06-30: .3 mL via OPHTHALMIC

## 2016-06-30 MED ORDER — NA HYALUR & NA CHOND-NA HYALUR 0.4-0.35 ML IO KIT
PACK | INTRAOCULAR | Status: DC | PRN
  Administered 2016-06-30: 1 mL via INTRAOCULAR

## 2016-06-30 MED ORDER — LACTATED RINGERS IV SOLN: INTRAVENOUS | Status: DC

## 2016-06-30 MED ORDER — LIDOCAINE HCL (PF) 2 % IJ SOLN
INTRAOCULAR | Status: DC | PRN
  Administered 2016-06-30: 1 mL via INTRAOCULAR

## 2016-06-30 MED ORDER — BRIMONIDINE TARTRATE-TIMOLOL 0.2-0.5 % OP SOLN
OPHTHALMIC | Status: DC | PRN
  Administered 2016-06-30: 1 [drp] via OPHTHALMIC

## 2016-06-30 MED ORDER — ARMC OPHTHALMIC DILATING DROPS
1.0000 "application " | OPHTHALMIC | Status: DC | PRN
  Administered 2016-06-30 (×3): 1 via OPHTHALMIC

## 2016-06-30 SURGICAL SUPPLY — 25 items
AcrySofIQ Astigmatism IOL 23.0D (Intraocular Lens) ×3 IMPLANT
CANNULA ANT/CHMB 27GA (MISCELLANEOUS) ×3 IMPLANT
CARTRIDGE ABBOTT (MISCELLANEOUS) IMPLANT
GLOVE SURG LX 7.5 STRW (GLOVE) ×2
GLOVE SURG LX STRL 7.5 STRW (GLOVE) ×1 IMPLANT
GLOVE SURG TRIUMPH 8.0 PF LTX (GLOVE) ×3 IMPLANT
GOWN STRL REUS W/ TWL LRG LVL3 (GOWN DISPOSABLE) ×2 IMPLANT
GOWN STRL REUS W/TWL LRG LVL3 (GOWN DISPOSABLE) ×4
MARKER SKIN DUAL TIP RULER LAB (MISCELLANEOUS) ×3 IMPLANT
NDL RETROBULBAR .5 NSTRL (NEEDLE) IMPLANT
NEEDLE FILTER BLUNT 18X 1/2SAF (NEEDLE) ×2
NEEDLE FILTER BLUNT 18X1 1/2 (NEEDLE) ×1 IMPLANT
PACK CATARACT BRASINGTON (MISCELLANEOUS) ×3 IMPLANT
PACK EYE AFTER SURG (MISCELLANEOUS) ×3 IMPLANT
PACK OPTHALMIC (MISCELLANEOUS) ×3 IMPLANT
RING MALYGIN 7.0 (MISCELLANEOUS) ×3 IMPLANT
SUT ETHILON 10-0 CS-B-6CS-B-6 (SUTURE)
SUT VICRYL  9 0 (SUTURE)
SUT VICRYL 9 0 (SUTURE) IMPLANT
SUTURE EHLN 10-0 CS-B-6CS-B-6 (SUTURE) IMPLANT
SYR 3ML LL SCALE MARK (SYRINGE) ×3 IMPLANT
SYR 5ML LL (SYRINGE) ×3 IMPLANT
SYR TB 1ML LUER SLIP (SYRINGE) ×3 IMPLANT
WATER STERILE IRR 250ML POUR (IV SOLUTION) ×3 IMPLANT
WIPE NON LINTING 3.25X3.25 (MISCELLANEOUS) ×3 IMPLANT

## 2016-06-30 NOTE — H&P (Signed)
The History and Physical notes are on paper, have been signed, and are to be scanned. The patient remains stable and unchanged from the H&P.   Previous H&P reviewed, patient examined, and there are no changes.  [name] [name] 06/30/2016 1:15 PM

## 2016-06-30 NOTE — Transfer of Care (Signed)
Immediate Anesthesia Transfer of Care Note  Patient: [name] [name]  Procedure(s) Performed: Procedure(s) with comments: CATARACT EXTRACTION PHACO AND INTRAOCULAR LENS PLACEMENT ([place])  left toric lens (Left) - Toric Lens  Patient Location: PACU  Anesthesia Type: MAC  Level of Consciousness: awake, alert  and patient cooperative  Airway and Oxygen Therapy: Patient Spontanous Breathing and Patient connected to supplemental oxygen  Post-op Assessment: Post-op Vital signs reviewed, Patient's Cardiovascular Status Stable, Respiratory Function Stable, Patent Airway and No signs of Nausea or vomiting  Post-op Vital Signs: Reviewed and stable  Complications: No apparent anesthesia complications

## 2016-06-30 NOTE — Anesthesia Preprocedure Evaluation (Signed)
Anesthesia Evaluation  Patient identified by MRN, date of birth, ID band Patient awake    Reviewed: Allergy & Precautions, H&P , NPO status , Patient's Chart, lab work & pertinent test results  Airway Mallampati: II  TM Distance: >3 FB Neck ROM: full    Dental no notable dental hx.    Pulmonary former smoker,    Pulmonary exam normal        Cardiovascular hypertension, Normal cardiovascular exam     Neuro/Psych    GI/Hepatic GERD  ,  Endo/Other    Renal/GU      Musculoskeletal   Abdominal   Peds  Hematology   Anesthesia Other Findings   Reproductive/Obstetrics                             Anesthesia Physical Anesthesia Plan  ASA: II  Anesthesia Plan: MAC   Post-op Pain Management:    Induction:   Airway Management Planned:   Additional Equipment:   Intra-op Plan:   Post-operative Plan:   Informed Consent: I have reviewed the patients History and Physical, chart, labs and discussed the procedure including the risks, benefits and alternatives for the proposed anesthesia with the patient or authorized representative who has indicated his/her understanding and acceptance.     Plan Discussed with:   Anesthesia Plan Comments:         Anesthesia Quick Evaluation

## 2016-06-30 NOTE — Op Note (Signed)
LOCATION:  [place]   PREOPERATIVE DIAGNOSIS:  Nuclear sclerotic cataract of the left eye with miotic pupil.  H25.12  POSTOPERATIVE DIAGNOSIS:  Nuclear sclerotic cataract of the left eye with miotic pupil.   PROCEDURE:  Phacoemulsification with Toric posterior chamber intraocular lens placement of the left eye which required pupil stretching with the Malyugin pupil expansion device .   LENS:  Implant Name Type Inv. Item Serial No. Manufacturer Lot No. LRB No. Used  AcrySofIQ Astigmatism IOL 23.0D Intraocular Lens   [id number] [employer]   Left 1   [id number] Toric intraocular lens with 3.0 diopters of cylindrical power with axis orientation at 175 degrees.   ULTRASOUND TIME: 19 % of 1 minutes, 15 seconds.  CDE 14.4   SURGEON:  Wyonia Hough, MD   ANESTHESIA:  Topical with tetracaine drops and 2% Xylocaine jelly, augmented with 1% preservative-free intracameral lidocaine.  COMPLICATIONS:  None.   DESCRIPTION OF PROCEDURE:  The patient was identified in the holding room and transported to the operating suite and placed in the supine position under the operating microscope.  The left eye was identified as the operative eye, and it was prepped and draped in the usual sterile ophthalmic fashion.    A clear-corneal paracentesis incision was made at the 1:30 position.  0.5 ml of preservative-free 1% lidocaine was injected into the anterior chamber. The anterior chamber was filled with Viscoat.  A 2.4 millimeter near clear corneal incision was then made at the 10:30 position.  A cystotome and capsulorrhexis forceps were then used to make a curvilinear capsulorrhexis. A A Malyugin ring was placed to expand the pupil from 19mm to 7 mm.  Hydrodissection and hydrodelineation were then performed using balanced salt solution.   Phacoemulsification was then used in stop and chop fashion to remove the lens, nucleus and epinucleus.  The remaining cortex was aspirated using the irrigation and  aspiration handpiece.  Provisc viscoelastic was then placed into the capsular bag to distend it for lens placement.  The Verion digital marker was used to align the implant at the intended axis.   A 23.0 diopter lens was then injected into the capsular bag.  It was rotated clockwise until the axis marks on the lens were approximately 15 degrees in the counterclockwise direction to the intended alignment.  The viscoelastic was aspirated from the eye using the irrigation aspiration handpiece.  Then, a Koch spatula through the sideport incision was used to rotate the lens in a clockwise direction until the axis markings of the intraocular lens were lined up with the Verion alignment. The Malyugin ring was removed. Balanced salt solution was then used to hydrate the wounds. Cefuroxime 0.1 ml of a 10mg /ml solution was injected into the anterior chamber for a dose of 1 mg of intracameral antibiotic at the completion of the case.    The eye was noted to have a physiologic pressure and there was no wound leak noted.   Timolol and Brimonidine drops were applied to the eye.  The patient was taken to the recovery room in stable condition having had no complications of anesthesia or surgery.  Baldwin Racicot 06/30/2016, 2:08 PM

## 2016-06-30 NOTE — Anesthesia Procedure Notes (Signed)
Procedure Name: MAC Date/Time: 06/30/2016 1:42 PM Performed by: Janna Arch Pre-anesthesia Checklist: Patient identified, Emergency Drugs available, Suction available and Patient being monitored Patient Re-evaluated:Patient Re-evaluated prior to inductionOxygen Delivery Method: Nasal cannula

## 2016-06-30 NOTE — Anesthesia Postprocedure Evaluation (Signed)
Anesthesia Post Note  Patient: [name] [name]  Procedure(s) Performed: Procedure(s) (LRB): CATARACT EXTRACTION PHACO AND INTRAOCULAR LENS PLACEMENT ([place])  left toric lens (Left)  Patient location during evaluation: PACU Anesthesia Type: MAC Level of consciousness: awake and alert and oriented Pain management: satisfactory to patient Vital Signs Assessment: post-procedure vital signs reviewed and stable Respiratory status: spontaneous breathing, nonlabored ventilation and respiratory function stable Cardiovascular status: blood pressure returned to baseline and stable Postop Assessment: Adequate PO intake and No signs of nausea or vomiting Anesthetic complications: no    Raliegh Ip

## 2016-07-01 ENCOUNTER — Encounter: Payer: Self-pay | Admitting: Ophthalmology

## 2016-07-16 DIAGNOSIS — H2511 Age-related nuclear cataract, right eye: Secondary | ICD-10-CM | POA: Diagnosis not present

## 2016-07-22 NOTE — Discharge Instructions (Signed)

## 2016-07-28 ENCOUNTER — Ambulatory Visit: Payer: PPO | Admitting: Anesthesiology

## 2016-07-28 ENCOUNTER — Encounter
Admission: RE | Disposition: A | Discharge status: Home / Self Care | Payer: Self-pay | Source: Ambulatory Visit | Attending: Ophthalmology

## 2016-07-28 ENCOUNTER — Ambulatory Visit
Admission: RE | Admit: 2016-07-28 | Discharge: 2016-07-28 | Disposition: A | Discharge status: Home / Self Care | Payer: PPO | Source: Ambulatory Visit | Attending: Ophthalmology | Admitting: Ophthalmology

## 2016-07-28 DIAGNOSIS — I10 Essential (primary) hypertension: Secondary | ICD-10-CM | POA: Diagnosis not present

## 2016-07-28 DIAGNOSIS — H2511 Age-related nuclear cataract, right eye: Secondary | ICD-10-CM | POA: Insufficient documentation

## 2016-07-28 DIAGNOSIS — K219 Gastro-esophageal reflux disease without esophagitis: Secondary | ICD-10-CM | POA: Insufficient documentation

## 2016-07-28 DIAGNOSIS — Z7982 Long term (current) use of aspirin: Secondary | ICD-10-CM | POA: Insufficient documentation

## 2016-07-28 DIAGNOSIS — Z79899 Other long term (current) drug therapy: Secondary | ICD-10-CM | POA: Diagnosis not present

## 2016-07-28 DIAGNOSIS — Z87891 Personal history of nicotine dependence: Secondary | ICD-10-CM | POA: Diagnosis not present

## 2016-07-28 DIAGNOSIS — F419 Anxiety disorder, unspecified: Secondary | ICD-10-CM | POA: Insufficient documentation

## 2016-07-28 HISTORY — PX: CATARACT EXTRACTION W/PHACO: SHX586

## 2016-07-28 SURGERY — PHACOEMULSIFICATION, CATARACT, WITH IOL INSERTION
Anesthesia: Monitor Anesthesia Care | Laterality: Right | Wound class: Clean

## 2016-07-28 MED ORDER — EPINEPHRINE PF 1 MG/ML IJ SOLN
INTRAOCULAR | Status: DC | PRN
  Administered 2016-07-28: 52 mL via OPHTHALMIC

## 2016-07-28 MED ORDER — MIDAZOLAM HCL 2 MG/2ML IJ SOLN
INTRAMUSCULAR | Status: DC | PRN
  Administered 2016-07-28: 2 mg via INTRAVENOUS

## 2016-07-28 MED ORDER — BRIMONIDINE TARTRATE-TIMOLOL 0.2-0.5 % OP SOLN
OPHTHALMIC | Status: DC | PRN
  Administered 2016-07-28: 1 [drp] via OPHTHALMIC

## 2016-07-28 MED ORDER — FENTANYL CITRATE (PF) 100 MCG/2ML IJ SOLN
INTRAMUSCULAR | Status: DC | PRN
  Administered 2016-07-28: 100 ug via INTRAVENOUS

## 2016-07-28 MED ORDER — CEFUROXIME OPHTHALMIC INJECTION 1 MG/0.1 ML
INJECTION | OPHTHALMIC | Status: DC | PRN
  Administered 2016-07-28: 0.1 mL via OPHTHALMIC

## 2016-07-28 MED ORDER — NA HYALUR & NA CHOND-NA HYALUR 0.4-0.35 ML IO KIT
PACK | INTRAOCULAR | Status: DC | PRN
  Administered 2016-07-28: 1 mL via INTRAOCULAR

## 2016-07-28 MED ORDER — ARMC OPHTHALMIC DILATING DROPS
1.0000 "application " | OPHTHALMIC | Status: DC | PRN
  Administered 2016-07-28 (×3): 1 via OPHTHALMIC

## 2016-07-28 MED ORDER — LIDOCAINE HCL (PF) 2 % IJ SOLN
INTRAOCULAR | Status: DC | PRN
  Administered 2016-07-28: 1 mL via INTRAOCULAR

## 2016-07-28 MED ORDER — LACTATED RINGERS IV SOLN: 1000.0000 mL | INTRAVENOUS | Status: DC

## 2016-07-28 MED ORDER — MOXIFLOXACIN HCL 0.5 % OP SOLN
1.0000 [drp] | OPHTHALMIC | Status: DC | PRN
  Administered 2016-07-28 (×3): 1 [drp] via OPHTHALMIC

## 2016-07-28 SURGICAL SUPPLY — 27 items
CANNULA ANT/CHMB 27GA (MISCELLANEOUS) ×3 IMPLANT
CARTRIDGE ABBOTT (MISCELLANEOUS) IMPLANT
GLOVE SURG LX 7.5 STRW (GLOVE) ×2
GLOVE SURG LX STRL 7.5 STRW (GLOVE) ×1 IMPLANT
GLOVE SURG TRIUMPH 8.0 PF LTX (GLOVE) ×3 IMPLANT
GOWN STRL REUS W/ TWL LRG LVL3 (GOWN DISPOSABLE) ×2 IMPLANT
GOWN STRL REUS W/TWL LRG LVL3 (GOWN DISPOSABLE) ×4
LENS IOL ACRSF IQ TRC 4 23.0 ×1 IMPLANT
LENS IOL ACRYSOF IQ TORIC 23.0 ×2 IMPLANT
LENS IOL IQ TORIC 4 23.0 ×1 IMPLANT
MARKER SKIN DUAL TIP RULER LAB (MISCELLANEOUS) ×3 IMPLANT
NDL RETROBULBAR .5 NSTRL (NEEDLE) IMPLANT
NEEDLE FILTER BLUNT 18X 1/2SAF (NEEDLE) ×2
NEEDLE FILTER BLUNT 18X1 1/2 (NEEDLE) ×1 IMPLANT
PACK CATARACT BRASINGTON (MISCELLANEOUS) ×3 IMPLANT
PACK EYE AFTER SURG (MISCELLANEOUS) ×3 IMPLANT
PACK OPTHALMIC (MISCELLANEOUS) ×3 IMPLANT
RING MALYGIN 7.0 (MISCELLANEOUS) IMPLANT
SUT ETHILON 10-0 CS-B-6CS-B-6 (SUTURE)
SUT VICRYL  9 0 (SUTURE)
SUT VICRYL 9 0 (SUTURE) IMPLANT
SUTURE EHLN 10-0 CS-B-6CS-B-6 (SUTURE) IMPLANT
SYR 3ML LL SCALE MARK (SYRINGE) ×3 IMPLANT
SYR 5ML LL (SYRINGE) ×3 IMPLANT
SYR TB 1ML LUER SLIP (SYRINGE) ×3 IMPLANT
WATER STERILE IRR 250ML POUR (IV SOLUTION) ×3 IMPLANT
WIPE NON LINTING 3.25X3.25 (MISCELLANEOUS) ×3 IMPLANT

## 2016-07-28 NOTE — H&P (Signed)
The History and Physical notes are on paper, have been signed, and are to be scanned. The patient remains stable and unchanged from the H&P.   Previous H&P reviewed, patient examined, and there are no changes.  [name] [name] 07/28/2016 9:54 AM

## 2016-07-28 NOTE — Anesthesia Postprocedure Evaluation (Signed)
Anesthesia Post Note  Patient: [name] [name]  Procedure(s) Performed: Procedure(s) (LRB): CATARACT EXTRACTION PHACO AND INTRAOCULAR LENS PLACEMENT ([place])  right toric  complicated (Right)  Patient location during evaluation: PACU Anesthesia Type: MAC Level of consciousness: awake Pain management: pain level controlled Vital Signs Assessment: post-procedure vital signs reviewed and stable Respiratory status: spontaneous breathing Cardiovascular status: blood pressure returned to baseline Postop Assessment: no headache Anesthetic complications: no    Lavonna Monarch

## 2016-07-28 NOTE — Anesthesia Procedure Notes (Signed)
Procedure Name: MAC Performed by: Mayme Genta Pre-anesthesia Checklist: Patient identified, Emergency Drugs available, Suction available, Timeout performed and Patient being monitored Patient Re-evaluated:Patient Re-evaluated prior to inductionOxygen Delivery Method: Nasal cannula Placement Confirmation: positive ETCO2

## 2016-07-28 NOTE — Op Note (Signed)
LOCATION:  [place]   PREOPERATIVE DIAGNOSIS:  Nuclear sclerotic cataract of the right eye.  H25.11   POSTOPERATIVE DIAGNOSIS:  Nuclear sclerotic cataract of the right eye.   PROCEDURE:  Phacoemulsification with Toric posterior chamber intraocular lens placement of the right eye.   LENS:  * No implants in log *   SN6AT4 23.0D  Toric intraocular lens with 2.25 diopters of cylindrical power with axis orientation at 20 degrees.   ULTRASOUND TIME: 13 % of 1 minutes, 12 seconds.  CDE 9.5   SURGEON:  Wyonia Hough, MD   ANESTHESIA: Topical with tetracaine drops and 2% Xylocaine jelly, augmented with 1% preservative-free intracameral lidocaine. .   COMPLICATIONS:  None.   DESCRIPTION OF PROCEDURE:  The patient was identified in the holding room and transported to the operating suite and placed in the supine position under the operating microscope.  The right eye was identified as the operative eye, and it was prepped and draped in the usual sterile ophthalmic fashion.    A clear-corneal paracentesis incision was made at the 12:00 position.  0.5 ml of preservative-free 1% lidocaine was injected into the anterior chamber. The anterior chamber was filled with Viscoat.  A 2.4 millimeter near clear corneal incision was then made at the 9:00 position.  A cystotome and capsulorrhexis forceps were then used to make a curvilinear capsulorrhexis.  Hydrodissection and hydrodelineation were then performed using balanced salt solution.   Phacoemulsification was then used in stop and chop fashion to remove the lens, nucleus and epinucleus.  The remaining cortex was aspirated using the irrigation and aspiration handpiece.  Provisc viscoelastic was then placed into the capsular bag to distend it for lens placement.  The Verion digital marker was used to align the implant at the intended axis.   A Toric lens was then injected into the capsular bag.  It was rotated clockwise until the axis  marks on the lens were approximately 15 degrees in the counterclockwise direction to the intended alignment.  The viscoelastic was aspirated from the eye using the irrigation aspiration handpiece.  Then, a Koch spatula through the sideport incision was used to rotate the lens in a clockwise direction until the axis markings of the intraocular lens were lined up with the Verion alignment.  Balanced salt solution was then used to hydrate the wounds. Cefuroxime 0.1 ml of a 10mg /ml solution was injected into the anterior chamber for a dose of 1 mg of intracameral antibiotic at the completion of the case.    The eye was noted to have a physiologic pressure and there was no wound leak noted.   Timolol and Brimonidine drops were applied to the eye.  The patient was taken to the recovery room in stable condition having had no complications of anesthesia or surgery.  [name] [name] 07/28/2016, 10:44 AM

## 2016-07-28 NOTE — Anesthesia Preprocedure Evaluation (Addendum)
Anesthesia Evaluation  Patient identified by MRN, date of birth, ID band Patient awake    Reviewed: Allergy & Precautions, NPO status , Patient's Chart, lab work & pertinent test results, reviewed documented beta blocker date and time   Airway Mallampati: II  TM Distance: >3 FB Neck ROM: Full    Dental no notable dental hx.    Pulmonary former smoker,    Pulmonary exam normal breath sounds clear to auscultation       Cardiovascular hypertension, Normal cardiovascular exam Rhythm:Regular Rate:Normal     Neuro/Psych PSYCHIATRIC DISORDERS Anxiety negative neurological ROS     GI/Hepatic GERD  ,  Endo/Other  negative endocrine ROS  Renal/GU negative Renal ROS  negative genitourinary   Musculoskeletal  (+) Arthritis ,   Abdominal Normal abdominal exam  (+)  Abdomen: soft.    Peds  Hematology negative hematology ROS (+)   Anesthesia Other Findings   Reproductive/Obstetrics                            Anesthesia Physical Anesthesia Plan  ASA: II  Anesthesia Plan: MAC   Post-op Pain Management:    Induction:   PONV Risk Score and Plan:   Airway Management Planned: Nasal Cannula  Additional Equipment: None  Intra-op Plan:   Post-operative Plan:   Informed Consent: I have reviewed the patients History and Physical, chart, labs and discussed the procedure including the risks, benefits and alternatives for the proposed anesthesia with the patient or authorized representative who has indicated his/her understanding and acceptance.     Plan Discussed with: CRNA, Anesthesiologist and Surgeon  Anesthesia Plan Comments:         Anesthesia Quick Evaluation

## 2016-07-28 NOTE — Transfer of Care (Signed)
Immediate Anesthesia Transfer of Care Note  Patient: [name] [name]  Procedure(s) Performed: Procedure(s) with comments: CATARACT EXTRACTION PHACO AND INTRAOCULAR LENS PLACEMENT ([place])  right toric  complicated (Right) - toric  malyugin  Patient Location: PACU  Anesthesia Type: MAC  Level of Consciousness: awake, alert  and patient cooperative  Airway and Oxygen Therapy: Patient Spontanous Breathing and Patient connected to supplemental oxygen  Post-op Assessment: Post-op Vital signs reviewed, Patient's Cardiovascular Status Stable, Respiratory Function Stable, Patent Airway and No signs of Nausea or vomiting  Post-op Vital Signs: Reviewed and stable  Complications: No apparent anesthesia complications

## 2016-07-29 ENCOUNTER — Encounter: Payer: Self-pay | Admitting: Ophthalmology

## 2016-08-03 ENCOUNTER — Encounter: Payer: Self-pay | Admitting: Ophthalmology

## 2016-08-11 DIAGNOSIS — D2261 Melanocytic nevi of right upper limb, including shoulder: Secondary | ICD-10-CM | POA: Diagnosis not present

## 2016-08-11 DIAGNOSIS — D225 Melanocytic nevi of trunk: Secondary | ICD-10-CM | POA: Diagnosis not present

## 2016-08-11 DIAGNOSIS — L570 Actinic keratosis: Secondary | ICD-10-CM | POA: Diagnosis not present

## 2016-08-11 DIAGNOSIS — D485 Neoplasm of uncertain behavior of skin: Secondary | ICD-10-CM | POA: Diagnosis not present

## 2016-08-11 DIAGNOSIS — X32XXXA Exposure to sunlight, initial encounter: Secondary | ICD-10-CM | POA: Diagnosis not present

## 2016-08-11 DIAGNOSIS — C44319 Basal cell carcinoma of skin of other parts of face: Secondary | ICD-10-CM | POA: Diagnosis not present

## 2016-08-11 DIAGNOSIS — Z85828 Personal history of other malignant neoplasm of skin: Secondary | ICD-10-CM | POA: Diagnosis not present

## 2016-08-11 DIAGNOSIS — D2262 Melanocytic nevi of left upper limb, including shoulder: Secondary | ICD-10-CM | POA: Diagnosis not present

## 2016-09-20 DIAGNOSIS — E785 Hyperlipidemia, unspecified: Secondary | ICD-10-CM | POA: Diagnosis not present

## 2016-09-20 DIAGNOSIS — I10 Essential (primary) hypertension: Secondary | ICD-10-CM | POA: Diagnosis not present

## 2016-09-30 DIAGNOSIS — C44319 Basal cell carcinoma of skin of other parts of face: Secondary | ICD-10-CM | POA: Diagnosis not present

## 2016-09-30 DIAGNOSIS — L905 Scar conditions and fibrosis of skin: Secondary | ICD-10-CM | POA: Diagnosis not present

## 2016-10-07 DIAGNOSIS — L905 Scar conditions and fibrosis of skin: Secondary | ICD-10-CM | POA: Diagnosis not present

## 2016-10-07 DIAGNOSIS — C44319 Basal cell carcinoma of skin of other parts of face: Secondary | ICD-10-CM | POA: Diagnosis not present

## 2016-11-03 DIAGNOSIS — I10 Essential (primary) hypertension: Secondary | ICD-10-CM | POA: Diagnosis not present

## 2017-01-07 DIAGNOSIS — L570 Actinic keratosis: Secondary | ICD-10-CM | POA: Diagnosis not present

## 2017-01-07 DIAGNOSIS — D225 Melanocytic nevi of trunk: Secondary | ICD-10-CM | POA: Diagnosis not present

## 2017-01-07 DIAGNOSIS — L821 Other seborrheic keratosis: Secondary | ICD-10-CM | POA: Diagnosis not present

## 2017-01-07 DIAGNOSIS — Z85828 Personal history of other malignant neoplasm of skin: Secondary | ICD-10-CM | POA: Diagnosis not present

## 2017-01-07 DIAGNOSIS — D2261 Melanocytic nevi of right upper limb, including shoulder: Secondary | ICD-10-CM | POA: Diagnosis not present

## 2017-01-07 DIAGNOSIS — X32XXXA Exposure to sunlight, initial encounter: Secondary | ICD-10-CM | POA: Diagnosis not present

## 2017-01-13 DIAGNOSIS — Z13220 Encounter for screening for lipoid disorders: Secondary | ICD-10-CM | POA: Diagnosis not present

## 2017-01-13 DIAGNOSIS — Z125 Encounter for screening for malignant neoplasm of prostate: Secondary | ICD-10-CM | POA: Diagnosis not present

## 2017-01-13 DIAGNOSIS — I10 Essential (primary) hypertension: Secondary | ICD-10-CM | POA: Diagnosis not present

## 2017-01-14 DIAGNOSIS — Z0000 Encounter for general adult medical examination without abnormal findings: Secondary | ICD-10-CM | POA: Diagnosis not present

## 2017-01-14 DIAGNOSIS — I10 Essential (primary) hypertension: Secondary | ICD-10-CM | POA: Diagnosis not present

## 2017-01-14 DIAGNOSIS — M810 Age-related osteoporosis without current pathological fracture: Secondary | ICD-10-CM | POA: Diagnosis not present

## 2017-01-14 DIAGNOSIS — K219 Gastro-esophageal reflux disease without esophagitis: Secondary | ICD-10-CM | POA: Diagnosis not present

## 2017-01-14 DIAGNOSIS — E785 Hyperlipidemia, unspecified: Secondary | ICD-10-CM | POA: Diagnosis not present

## 2017-01-14 DIAGNOSIS — F411 Generalized anxiety disorder: Secondary | ICD-10-CM | POA: Diagnosis not present

## 2017-02-10 DIAGNOSIS — Z8739 Personal history of other diseases of the musculoskeletal system and connective tissue: Secondary | ICD-10-CM | POA: Diagnosis not present

## 2017-02-16 DIAGNOSIS — Z961 Presence of intraocular lens: Secondary | ICD-10-CM | POA: Diagnosis not present

## 2017-06-09 DIAGNOSIS — M25562 Pain in left knee: Secondary | ICD-10-CM | POA: Diagnosis not present

## 2017-06-09 DIAGNOSIS — M25552 Pain in left hip: Secondary | ICD-10-CM | POA: Diagnosis not present

## 2017-06-09 DIAGNOSIS — M898X5 Other specified disorders of bone, thigh: Secondary | ICD-10-CM | POA: Diagnosis not present

## 2017-06-10 ENCOUNTER — Other Ambulatory Visit: Payer: Self-pay | Admitting: Physician Assistant

## 2017-06-10 DIAGNOSIS — M25562 Pain in left knee: Secondary | ICD-10-CM

## 2017-06-10 DIAGNOSIS — M898X5 Other specified disorders of bone, thigh: Secondary | ICD-10-CM

## 2017-06-21 DIAGNOSIS — H353131 Nonexudative age-related macular degeneration, bilateral, early dry stage: Secondary | ICD-10-CM | POA: Diagnosis not present

## 2017-06-22 ENCOUNTER — Encounter
Admission: RE | Admit: 2017-06-22 | Discharge: 2017-06-22 | Disposition: A | Discharge status: Home / Self Care | Payer: PPO | Source: Ambulatory Visit | Attending: Physician Assistant | Admitting: Physician Assistant

## 2017-06-22 DIAGNOSIS — M25562 Pain in left knee: Secondary | ICD-10-CM | POA: Insufficient documentation

## 2017-06-22 DIAGNOSIS — M898X5 Other specified disorders of bone, thigh: Secondary | ICD-10-CM | POA: Diagnosis not present

## 2017-06-22 DIAGNOSIS — Z471 Aftercare following joint replacement surgery: Secondary | ICD-10-CM | POA: Diagnosis not present

## 2017-06-22 DIAGNOSIS — Z96643 Presence of artificial hip joint, bilateral: Secondary | ICD-10-CM | POA: Diagnosis not present

## 2017-06-22 MED ORDER — TECHNETIUM TC 99M MEDRONATE IV KIT
20.0000 | PACK | Freq: Once | INTRAVENOUS | Status: AC | PRN
  Administered 2017-06-22: 22.87 via INTRAVENOUS

## 2017-07-15 DIAGNOSIS — I10 Essential (primary) hypertension: Secondary | ICD-10-CM | POA: Diagnosis not present

## 2017-07-15 DIAGNOSIS — E785 Hyperlipidemia, unspecified: Secondary | ICD-10-CM | POA: Diagnosis not present

## 2017-07-15 DIAGNOSIS — F410 Panic disorder [episodic paroxysmal anxiety] without agoraphobia: Secondary | ICD-10-CM | POA: Diagnosis not present

## 2017-09-08 DIAGNOSIS — X32XXXA Exposure to sunlight, initial encounter: Secondary | ICD-10-CM | POA: Diagnosis not present

## 2017-09-08 DIAGNOSIS — D485 Neoplasm of uncertain behavior of skin: Secondary | ICD-10-CM | POA: Diagnosis not present

## 2017-09-08 DIAGNOSIS — L820 Inflamed seborrheic keratosis: Secondary | ICD-10-CM | POA: Diagnosis not present

## 2017-09-08 DIAGNOSIS — C44319 Basal cell carcinoma of skin of other parts of face: Secondary | ICD-10-CM | POA: Diagnosis not present

## 2017-09-08 DIAGNOSIS — L570 Actinic keratosis: Secondary | ICD-10-CM | POA: Diagnosis not present

## 2017-09-08 DIAGNOSIS — Z85828 Personal history of other malignant neoplasm of skin: Secondary | ICD-10-CM | POA: Diagnosis not present

## 2017-09-08 DIAGNOSIS — D2261 Melanocytic nevi of right upper limb, including shoulder: Secondary | ICD-10-CM | POA: Diagnosis not present

## 2017-09-08 DIAGNOSIS — L821 Other seborrheic keratosis: Secondary | ICD-10-CM | POA: Diagnosis not present

## 2017-09-08 DIAGNOSIS — D2262 Melanocytic nevi of left upper limb, including shoulder: Secondary | ICD-10-CM | POA: Diagnosis not present

## 2017-09-08 DIAGNOSIS — Z08 Encounter for follow-up examination after completed treatment for malignant neoplasm: Secondary | ICD-10-CM | POA: Diagnosis not present

## 2017-09-08 DIAGNOSIS — L298 Other pruritus: Secondary | ICD-10-CM | POA: Diagnosis not present

## 2017-09-08 DIAGNOSIS — L538 Other specified erythematous conditions: Secondary | ICD-10-CM | POA: Diagnosis not present

## 2017-09-19 DIAGNOSIS — C44319 Basal cell carcinoma of skin of other parts of face: Secondary | ICD-10-CM | POA: Diagnosis not present
